# Patient Record
Sex: Female | Born: 1960 | Hispanic: No | Marital: Single
Health system: Midwestern US, Academic
[De-identification: ages and names within clinical notes are randomized; demographics above are authoritative.]

---

## 2017-12-08 IMAGING — CR UP_EXM
1 series · 1 of 1 positions shown · non-contrast
Comparison: none

[finger]
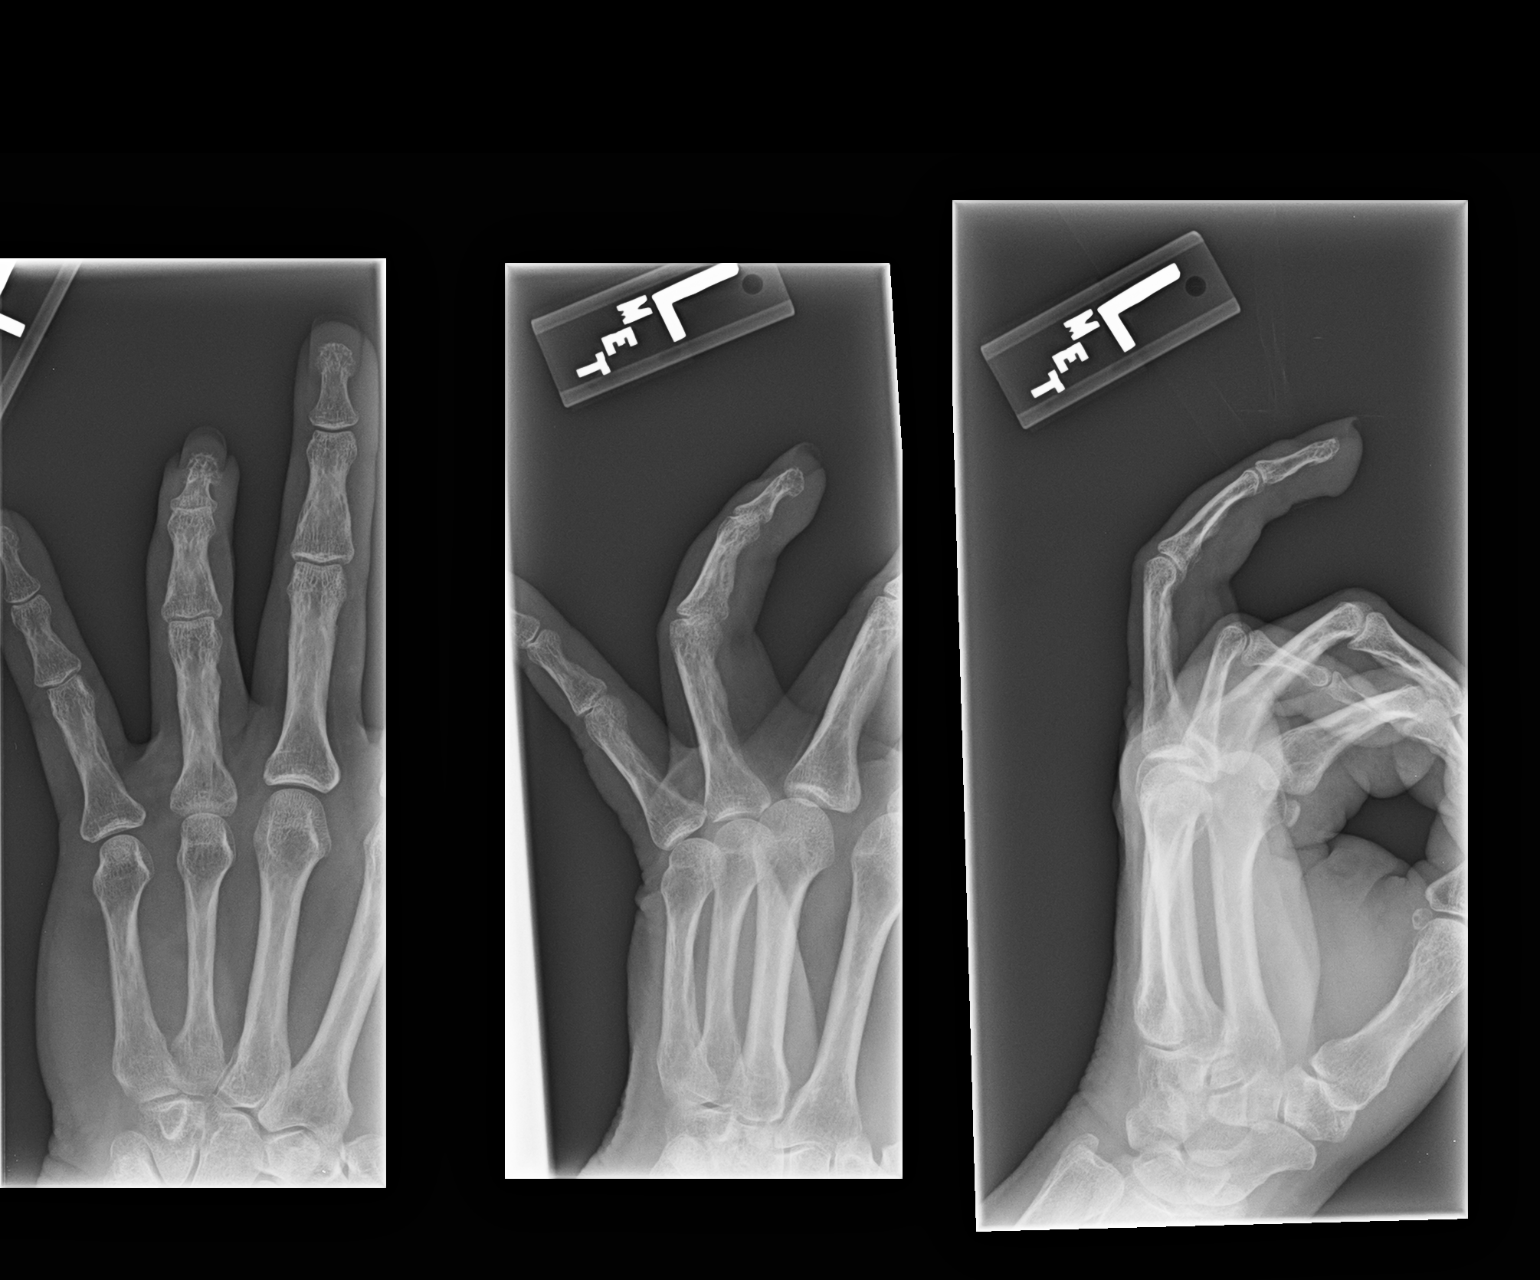

[1 of 1 positions shown; findings below may reference images not displayed]

DIAGNOSTIC STUDIES

EXAM

LEFT 4TH FINGER

INDICATION

LT RING FINGER PAIN
PT. FINGER WON'T STRAIGHTEN FOR 9-10 YEARS NO PAIN

TECHNIQUE

AP, lateral, oblique views left 4th finger

COMPARISONS

None

FINDINGS

No dislocation nor acute bony injury of the left 4th finger is identified. Flexion deformity at the
PIP and to lesser degree DIP joint is identified.

IMPRESSION

No acute bony injury of the left 4th finger. Flexion deformity of the 4th PIP and to lesser degree
DIP joints

Tech Notes:

PT. FINGER WON'T STRAIGHTEN FOR 9-10 YEARS NO PAIN

## 2018-08-13 IMAGING — MG MAMMOGRAM 3D SCREEN, BILATERAL
11 of 16 series · 11 of 16 positions shown · non-contrast
Comparison: none

[R CC (1 of 2)]
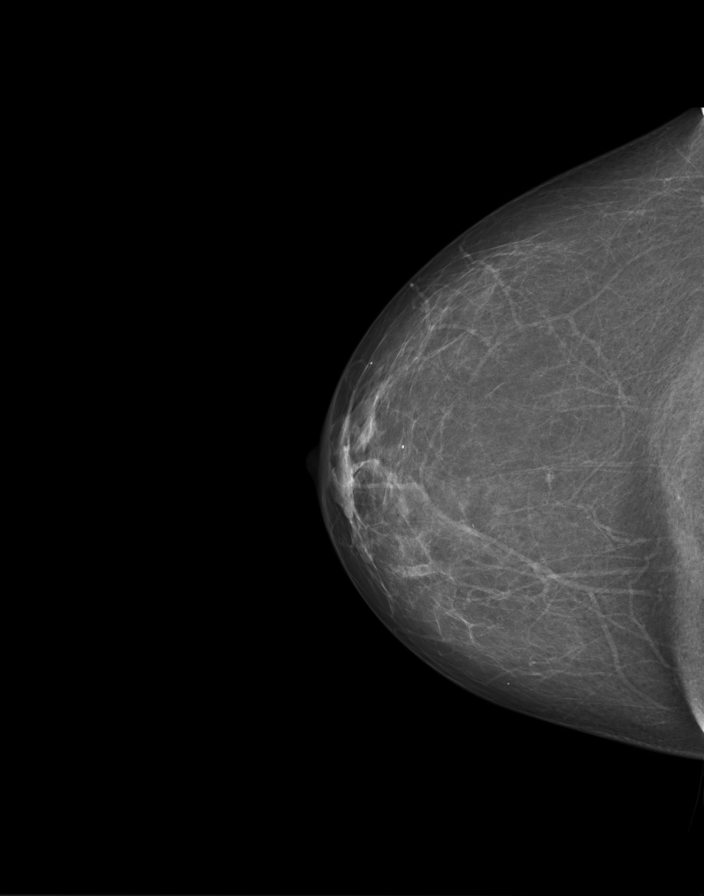

[R tomo (1 of 2)]
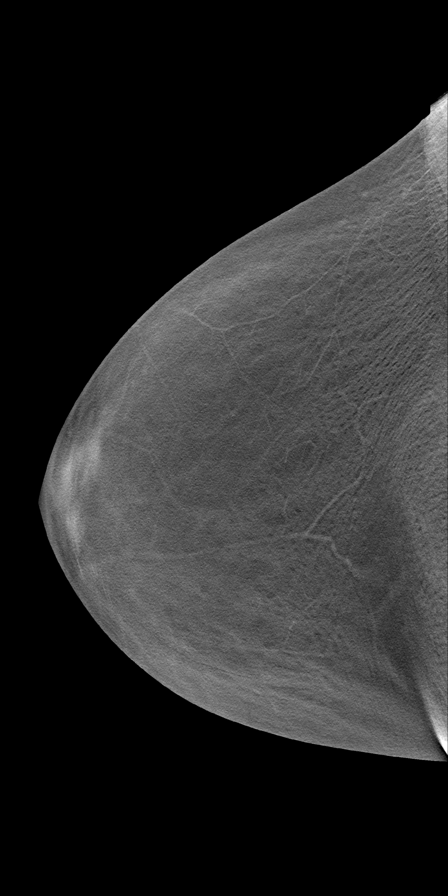

[R CC (2 of 2)]
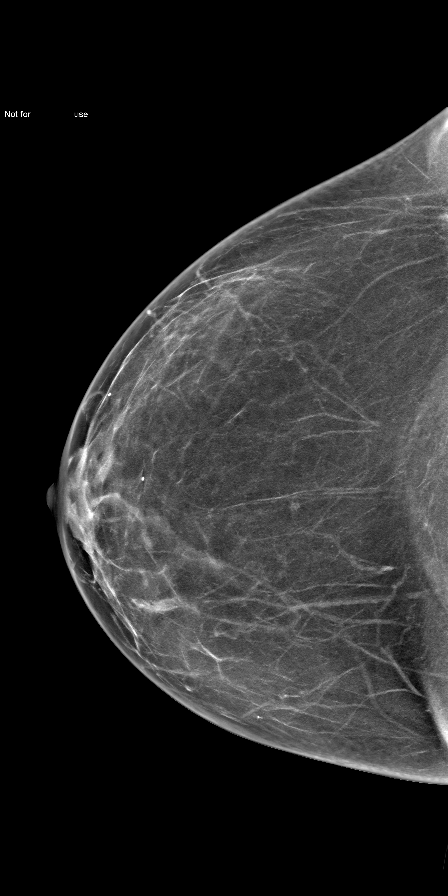

[R]
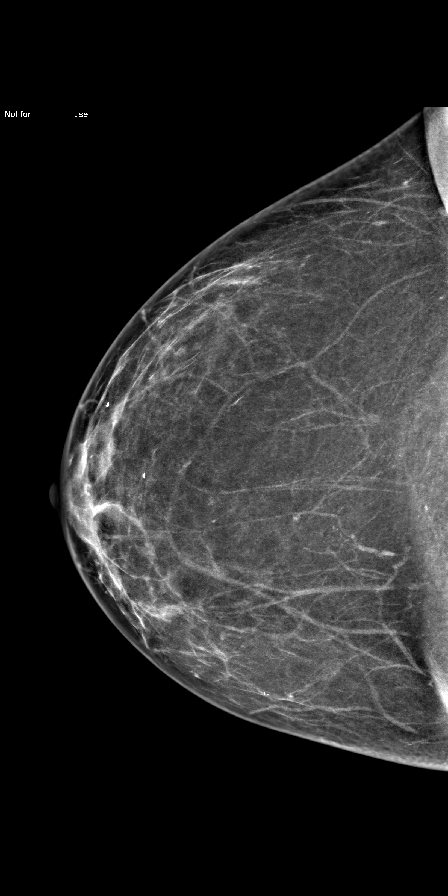

[L CC (1 of 2)]
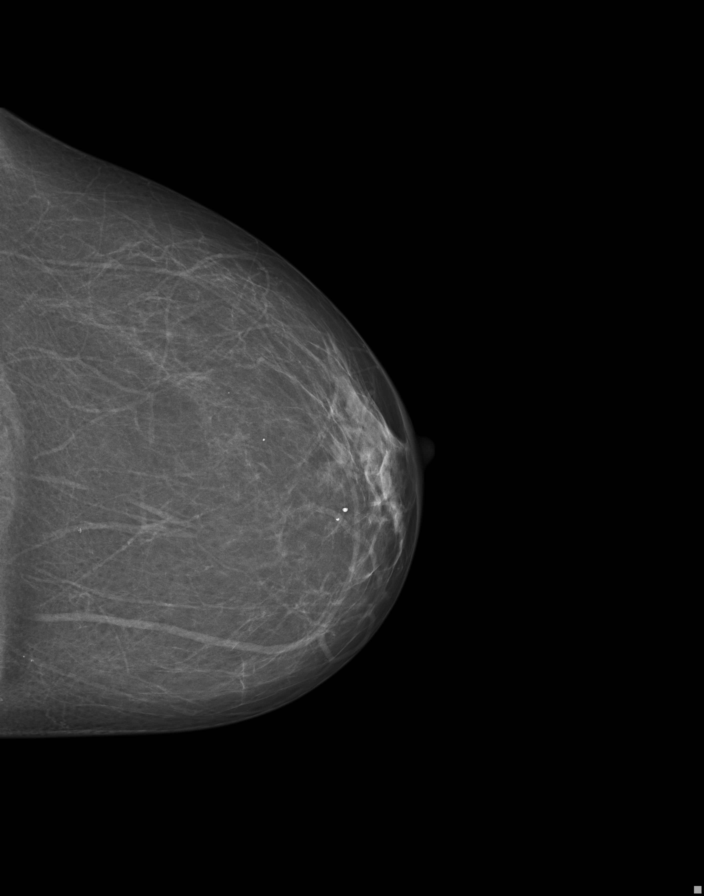

[L tomo]
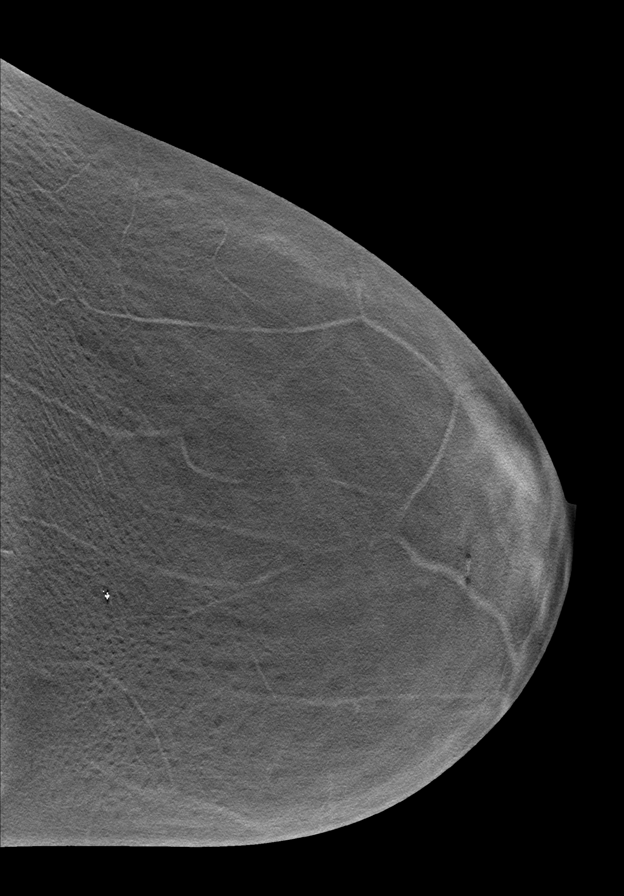

[L CC (2 of 2)]
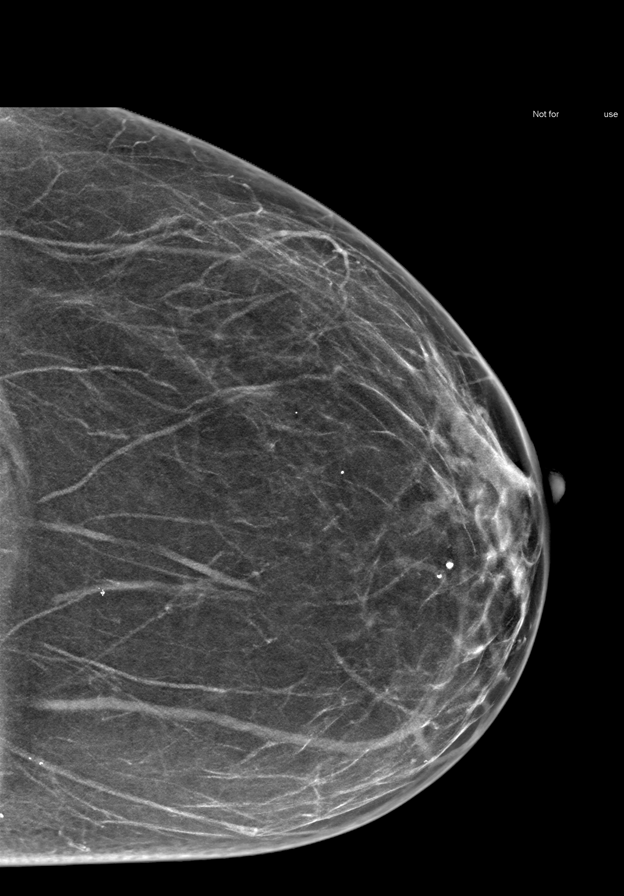

[L]
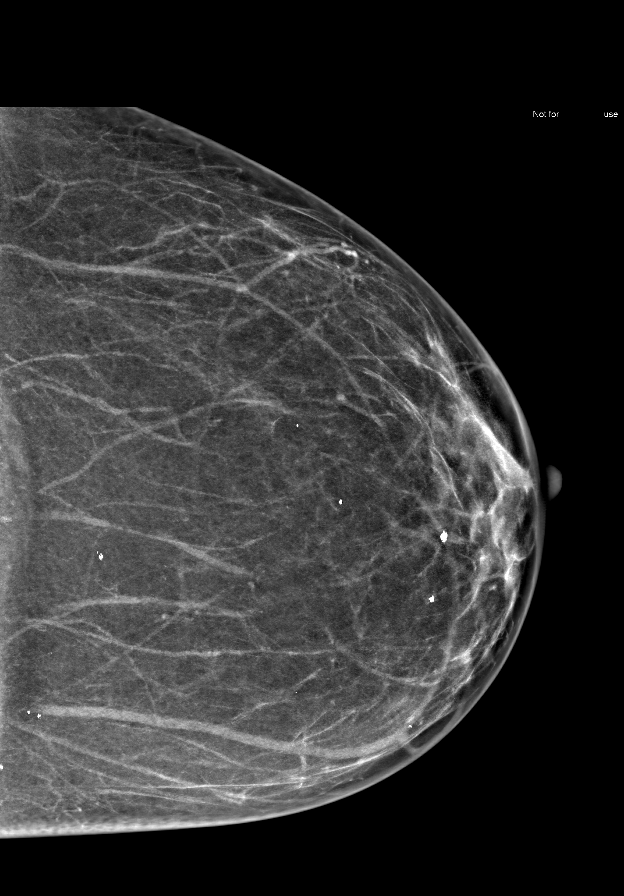

[R MLO (1 of 2)]
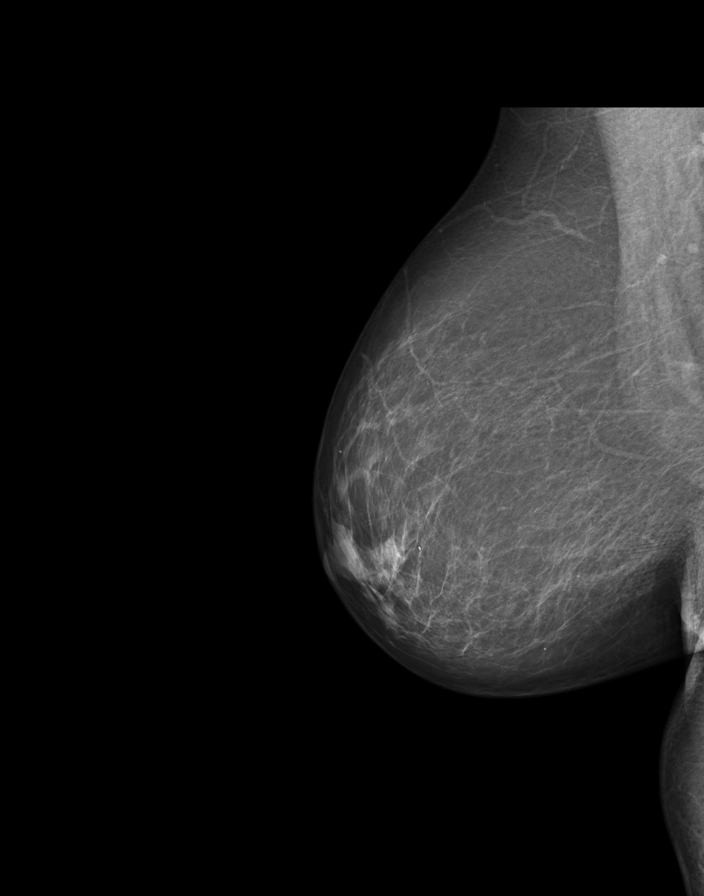

[R tomo (2 of 2)]
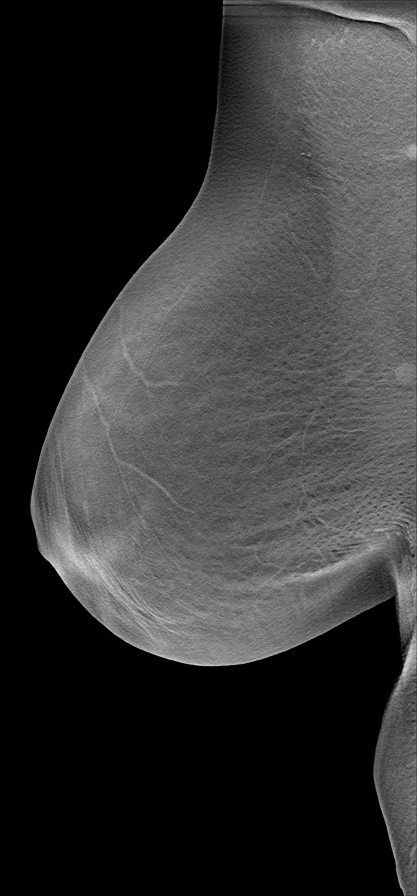

[R MLO (2 of 2)]
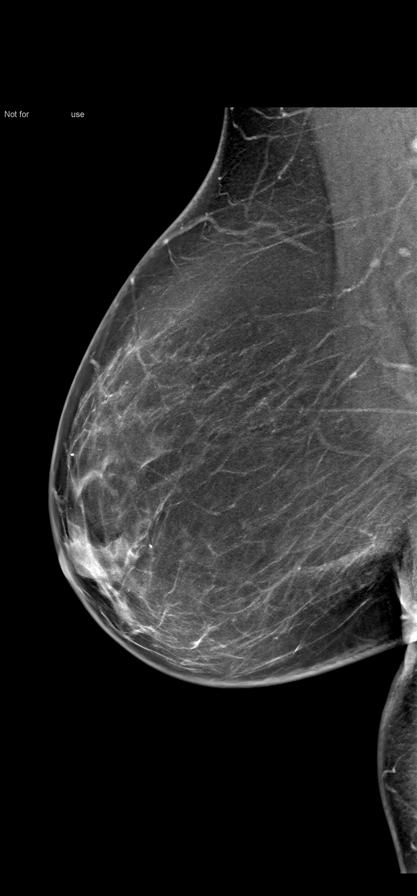

[11 of 16 positions shown; findings below may reference images not displayed]

EXAM
3D SCREENING MAMMOGRAM, BILATERAL

INDICATION
screening mammogram
BASELINE. SCR. LM

TECHNIQUE
Digital 2D CC and MLO projections obtained with 3D tomographic views per manufacturer's protocol.
ICAD version 7.2 was used during this exam.

COMPARISONS
Baseline exam.

FINDINGS
ACR Type 2: 25-50% There are scattered fibroglandular densities.
No concerning masses or calcifications seen.

IMPRESSION
No mammographic evidence of malignancy. One year follow-up is recommended.
BI-RADS 2, BENIGN.

Tech Notes:

## 2021-08-10 IMAGING — CR [ID]
1 series · 1 of 1 positions shown · non-contrast
Comparison: none

[x chest ap]
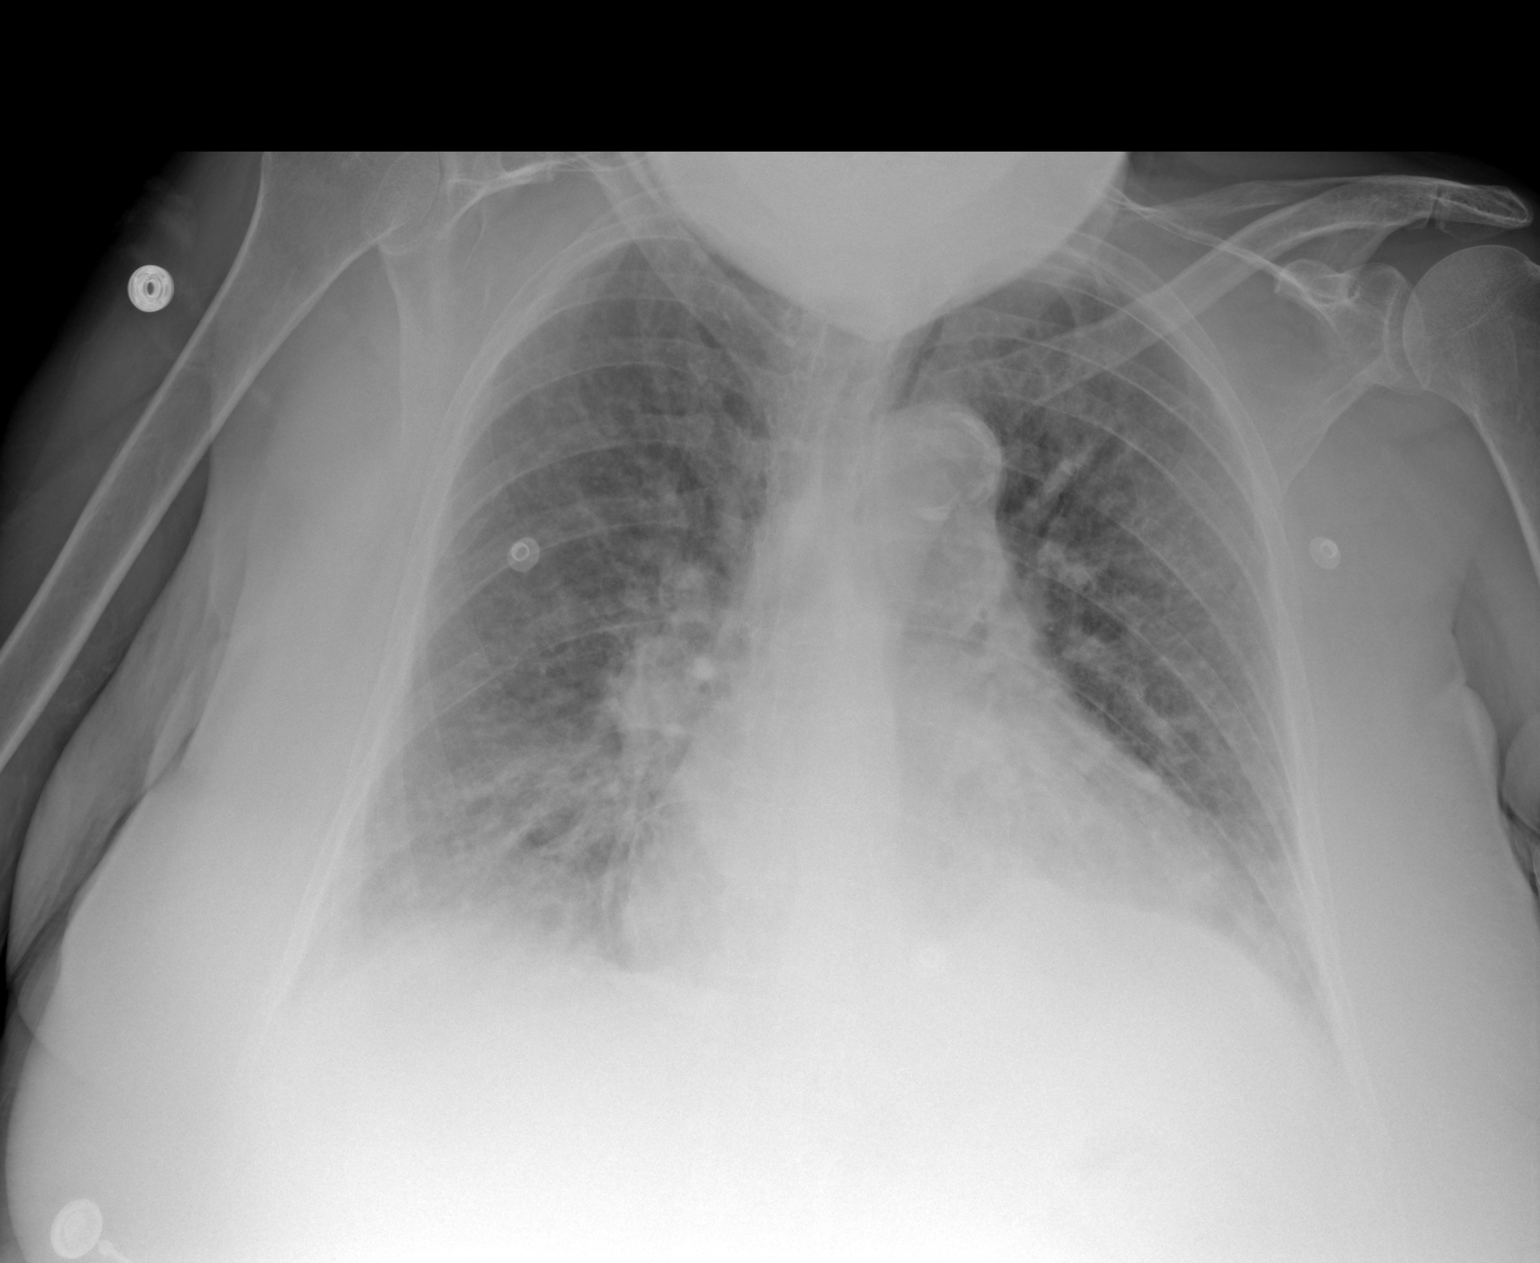

[1 of 1 positions shown; findings below may reference images not displayed]

DIAGNOSTIC STUDIES

EXAM

XR chest 1V

INDICATION

LE edema
PT REPORTS LOWER LEG SWELLING X 2 MONTHS.  HX: HTN, HIGH CHOLESTEROL, COPD, SMOKER.  AB/RG

TECHNIQUE

Single-view

COMPARISONS

None

FINDINGS

Cardiomegaly. Vascular congestion and central predominant edema. No pneumothorax is identified,
lung apices are partially obscured by patient head positioning. No acute osseous findings.

IMPRESSION

Cardiomegaly with vascular congestion and central edema.

Tech Notes:

PT REPORTS LOWER LEG SWELLING X 2 MONTHS.  HX: HTN, HIGH CHOLESTEROL, COPD, SMOKER.  AB/RG

## 2021-08-13 IMAGING — CR [ID]
3 series · 3 of 3 positions shown · non-contrast
Comparison: none

[x foot ap left]
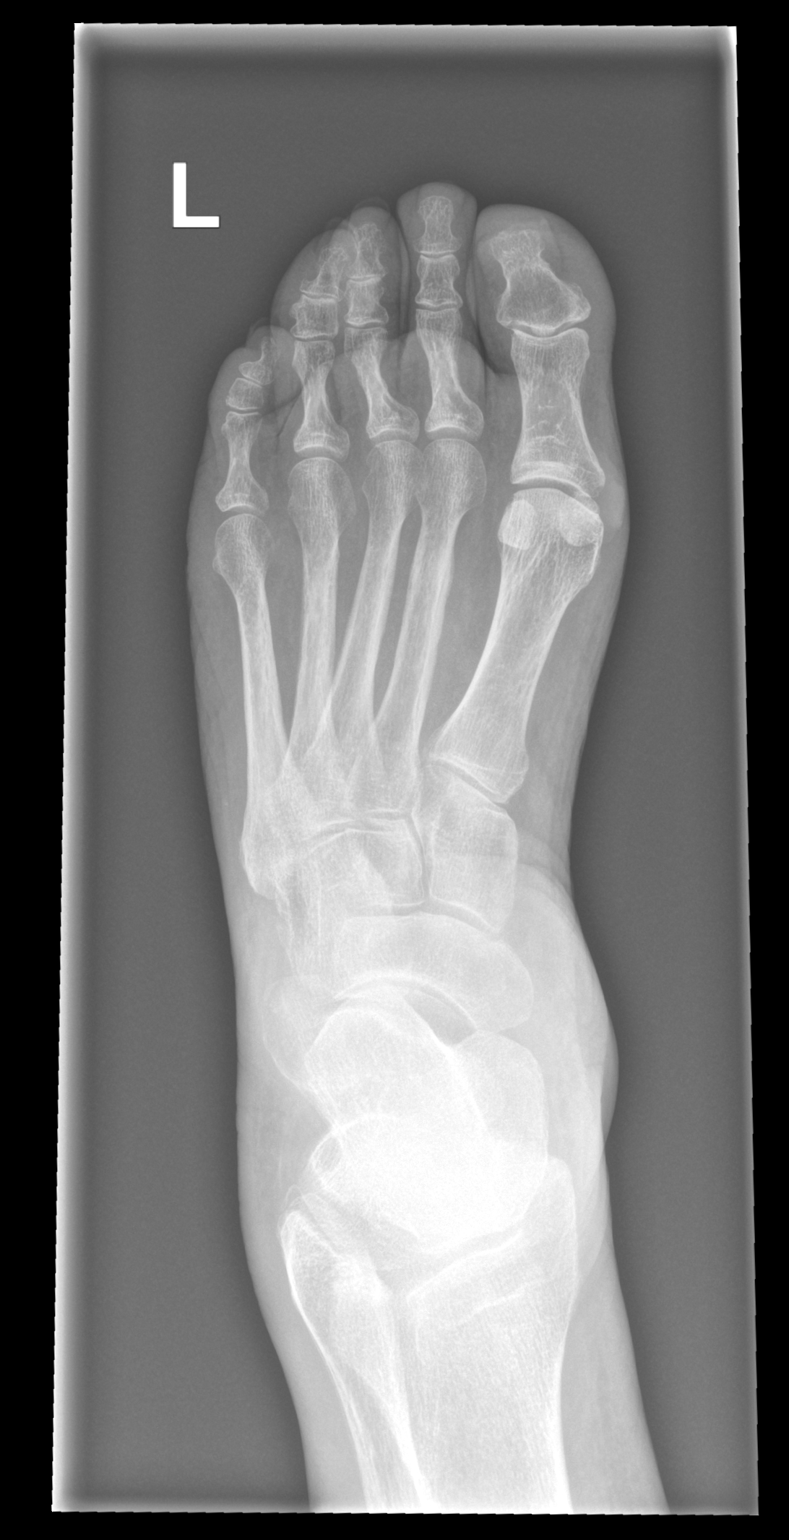

[x foot obl left]
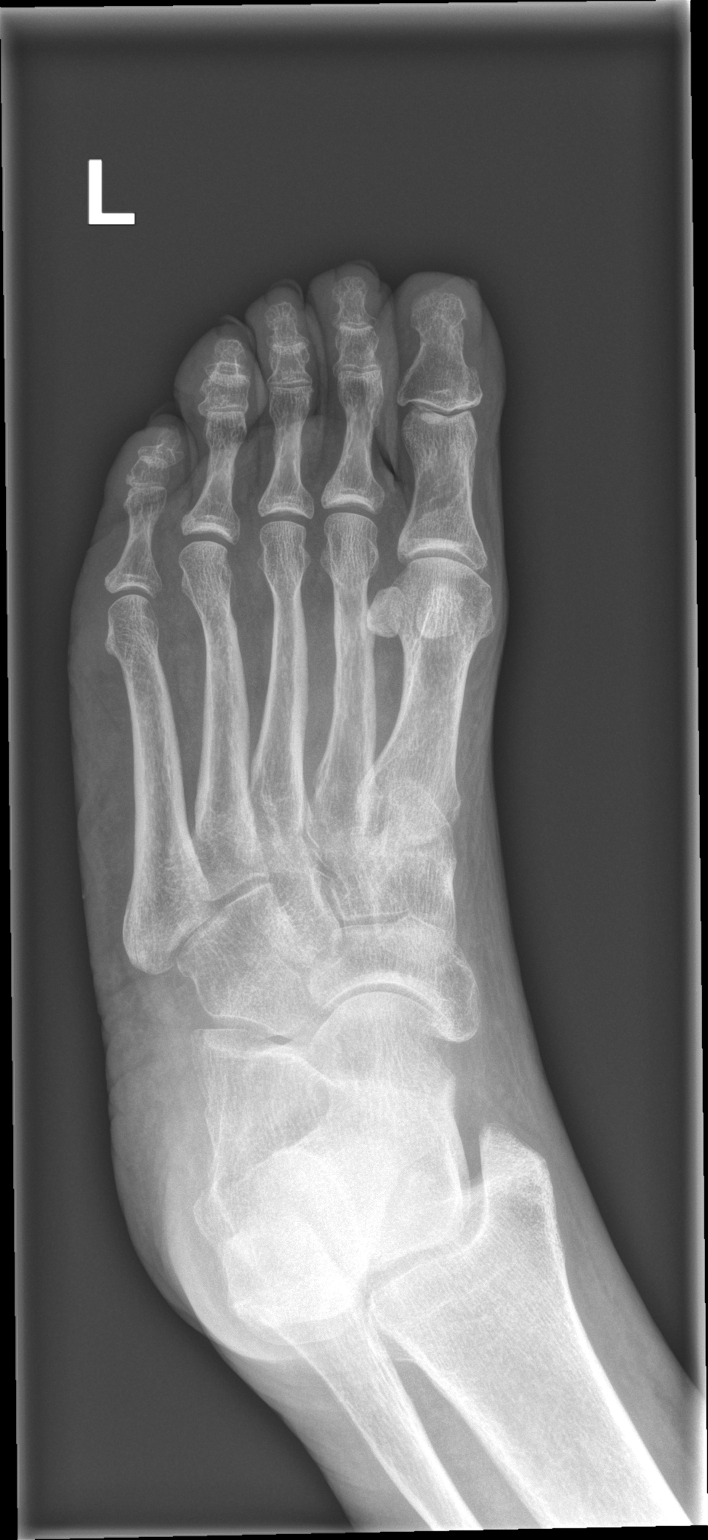

[x foot lat left]
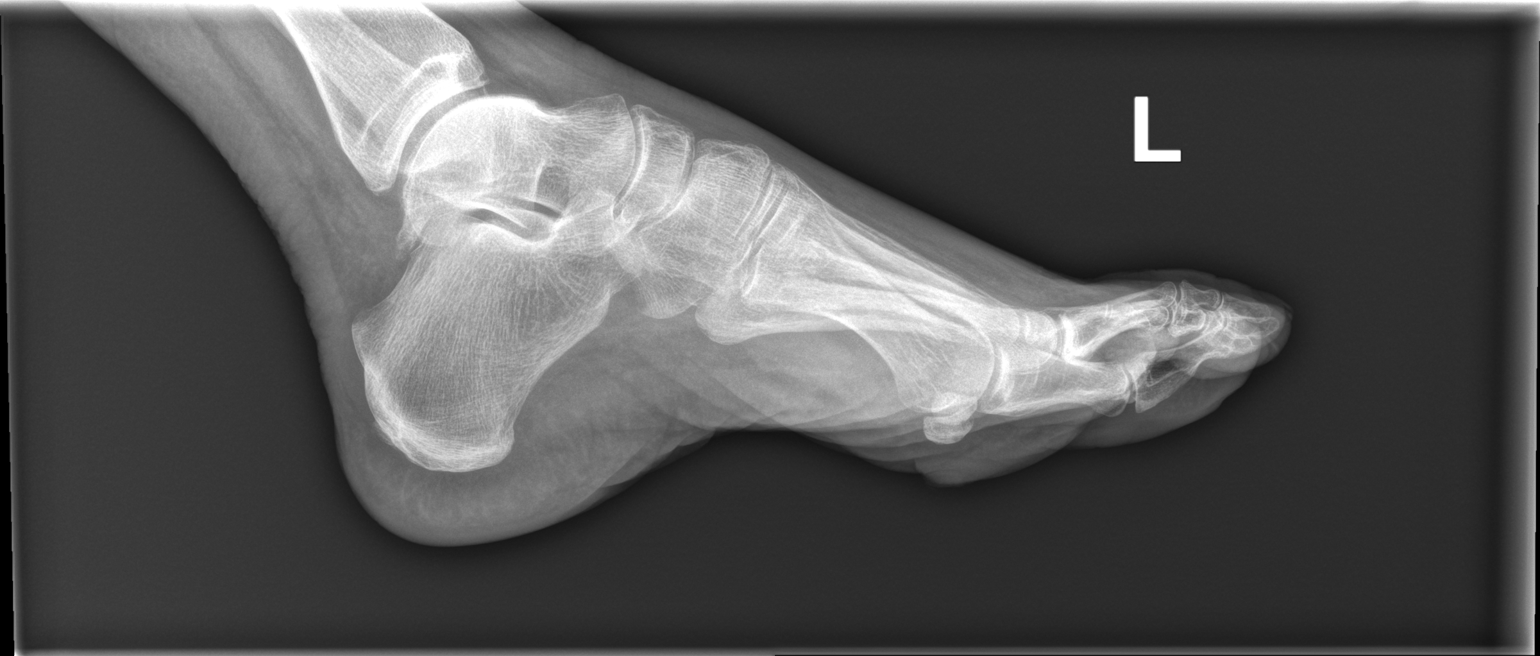

[3 of 3 positions shown; findings below may reference images not displayed]

DIAGNOSTIC STUDIES

EXAM

XR foot LT min 3V

INDICATION

DFU
DFU

TECHNIQUE

AP lateral and oblique views

COMPARISONS

None available

FINDINGS

Mild diffuse demineralization is seen. No fractures are evident. No areas of osseous destruction
are seen to indicate osteomyelitis.

IMPRESSION

Demineralization and mild degenerative changes. No fractures are seen. No erosions are evident to
indicate osteomyelitis.

Tech Notes:

DFU

## 2021-08-13 IMAGING — US ADUPLEBI
1 series · 13 of 16 positions shown · non-contrast
Comparison: none

[Series 1: us arterial duplex low ext bi · arterial · 13 of 56 slices shown]
[im 1/56]
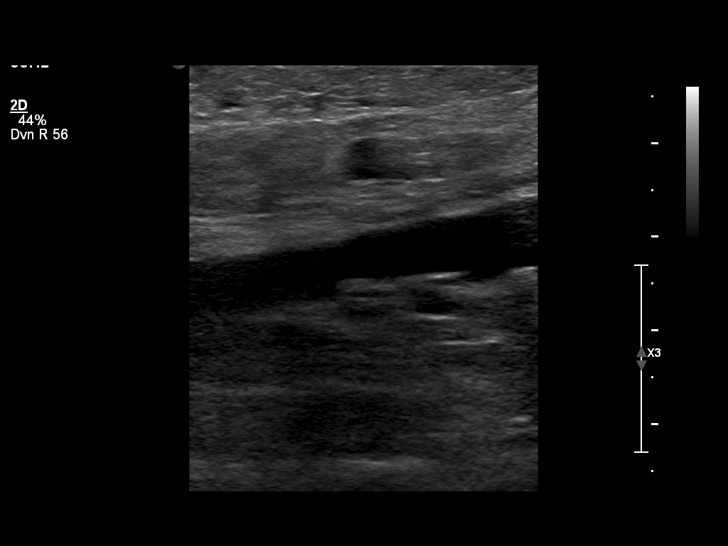
[im 4/56]
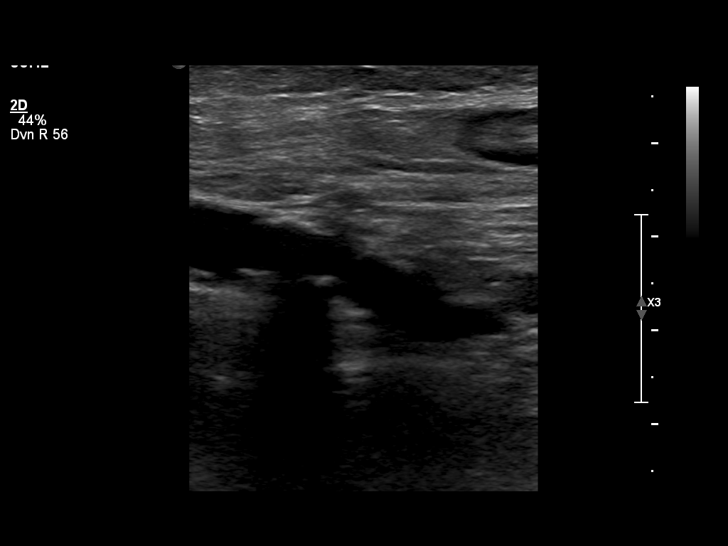
[im 12/56]
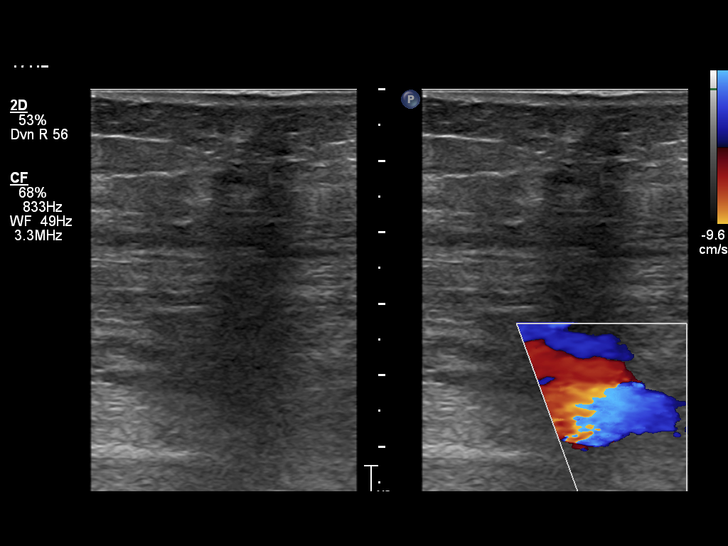
[im 15/56]
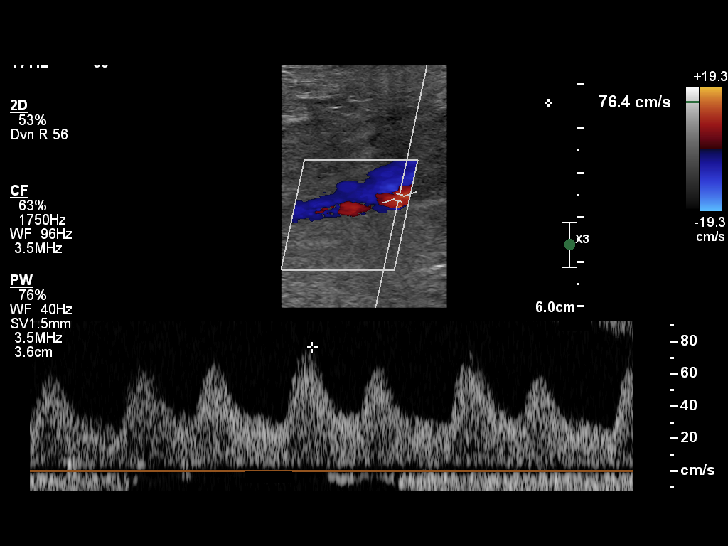
[im 19/56]
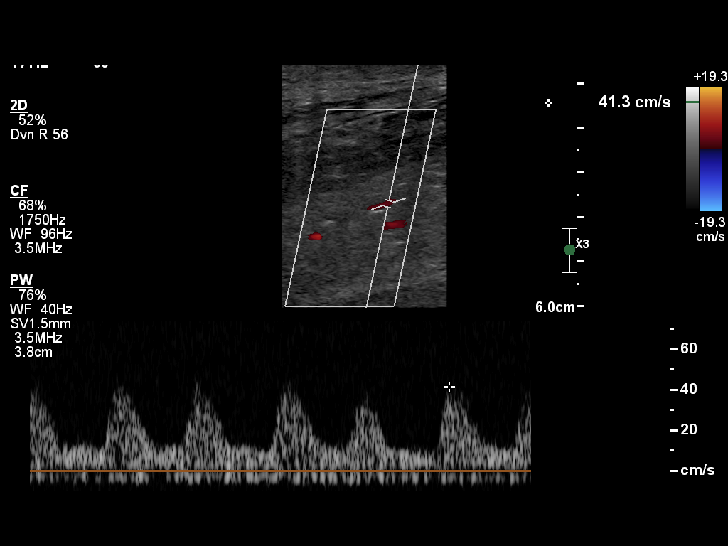
[im 23/56]
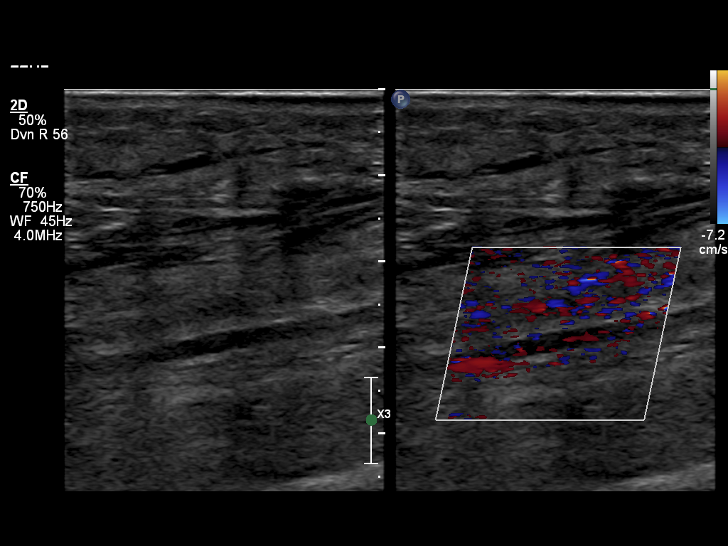
[im 30/56]
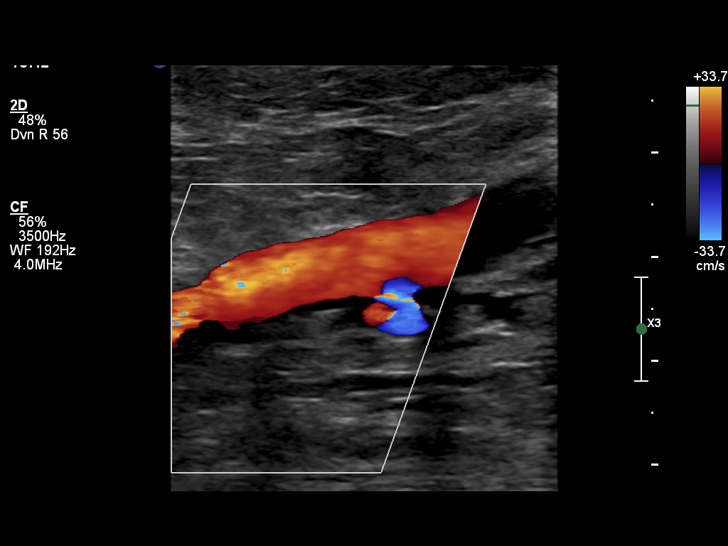
[im 34/56]
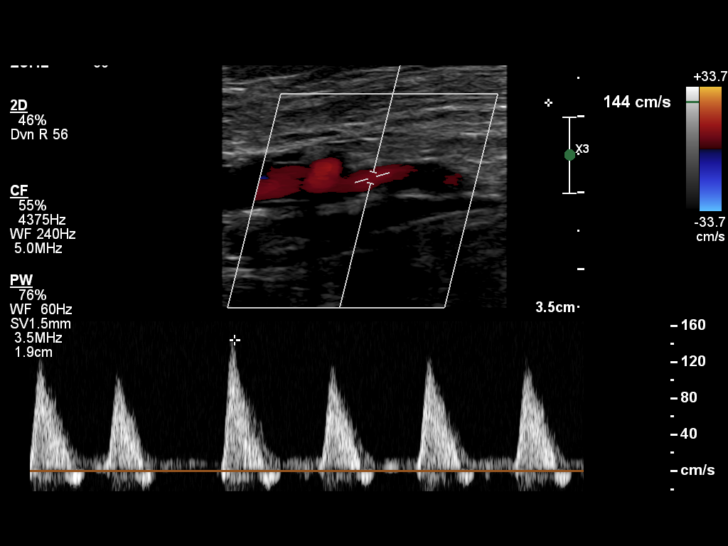
[im 37/56]
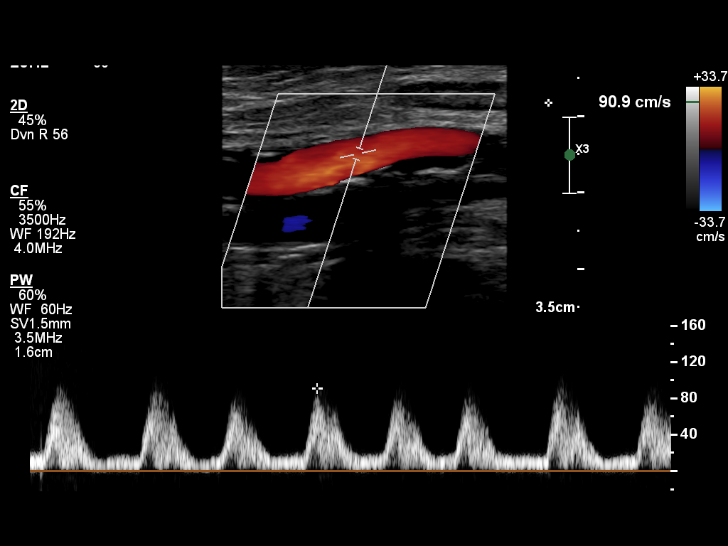
[im 41/56]
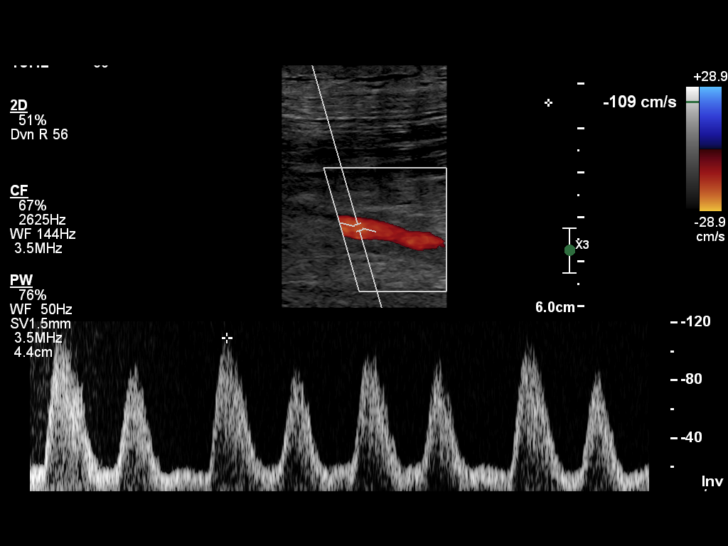
[im 45/56]
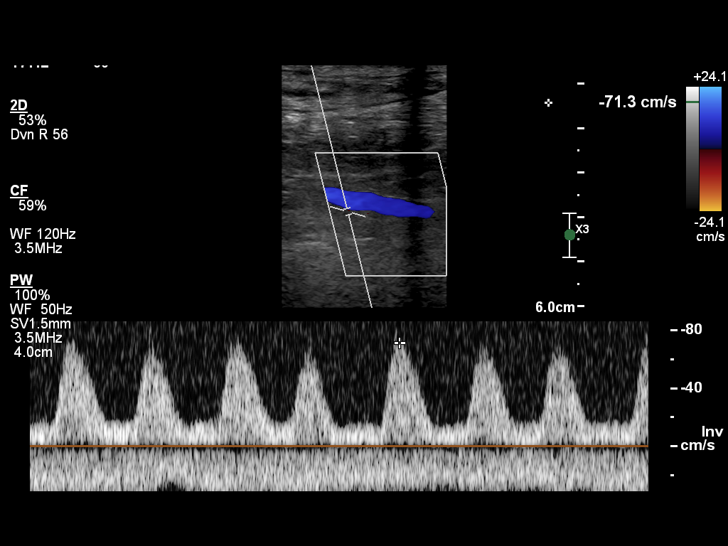
[im 52/56]
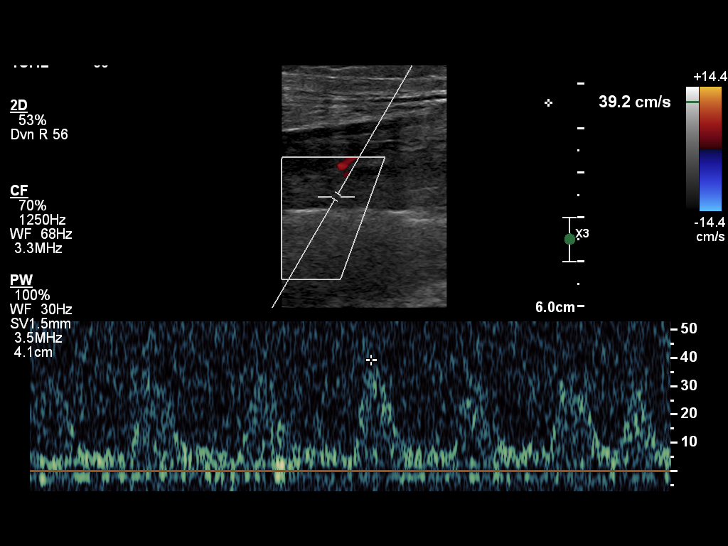
[im 56/56]
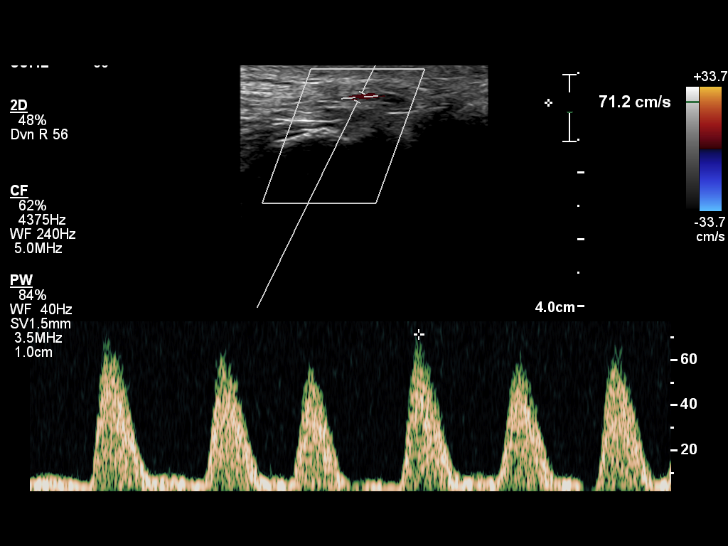

[13 of 16 positions shown; findings below may reference images not displayed]

DIAGNOSTIC STUDIES

EXAM

Bilateral lower extremity arterial Doppler ultrasound

INDICATION

PAD
TB

TECHNIQUE

High-resolution duplex imaging of the bilateral lower extremities was obtained. This includes
color, Doppler, and spectral analysis.

COMPARISONS

None available

FINDINGS

Right lower extremity:
Elevated systolic velocities are seen in the right femoral profunda consistent with narrowing of
50-70 percent. There is triphasic flow in the right superficial femoral artery.

Monophasic flow is noted beginning in the right superficial femoral artery extending distally with
poor visualization of the distal posterior tibial artery consistent with diffuse atherosclerosis.

Left lower extremity:Triphasic flow is noted the left common femoral and femoral profunda arteries.

There is monophasic flow throughout the left superficial femoral artery with elevated systolic
velocities in the region of the adductor canal consistent with 70 percent or greater stenosis.
Monophasic flow is noted throughout the left popliteal and trifurcation vessels with poor
visualization of the left posterior tibial artery distally.

IMPRESSION

70 percent or greater stenosis of the left superficial femoral artery in the region of the
adductor canal.

Diffuse atherosclerosis throughout the right lower extremity with moderate high-grade stenosis of
the right femoral profunda.

Atherosclerotic changes throughout the trifurcation vessels bilaterally.

Tech Notes:

TB

## 2021-08-13 IMAGING — CR [ID]
3 series · 3 of 3 positions shown · non-contrast
Comparison: none

[x foot ap right]
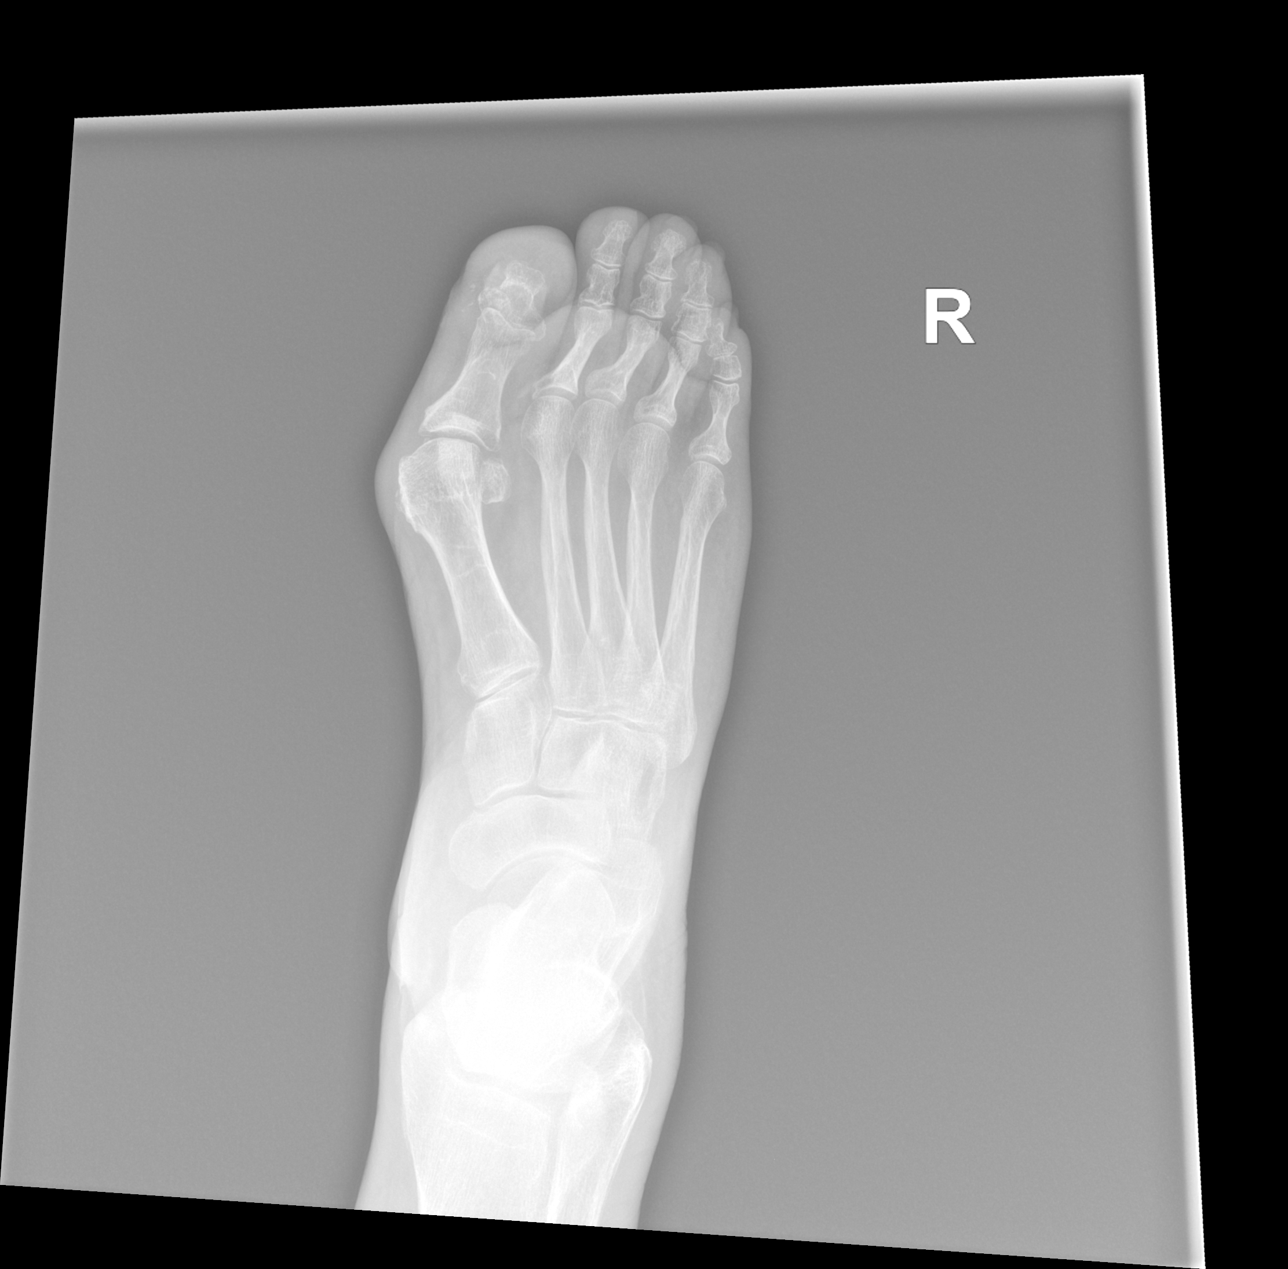

[x foot obl right]
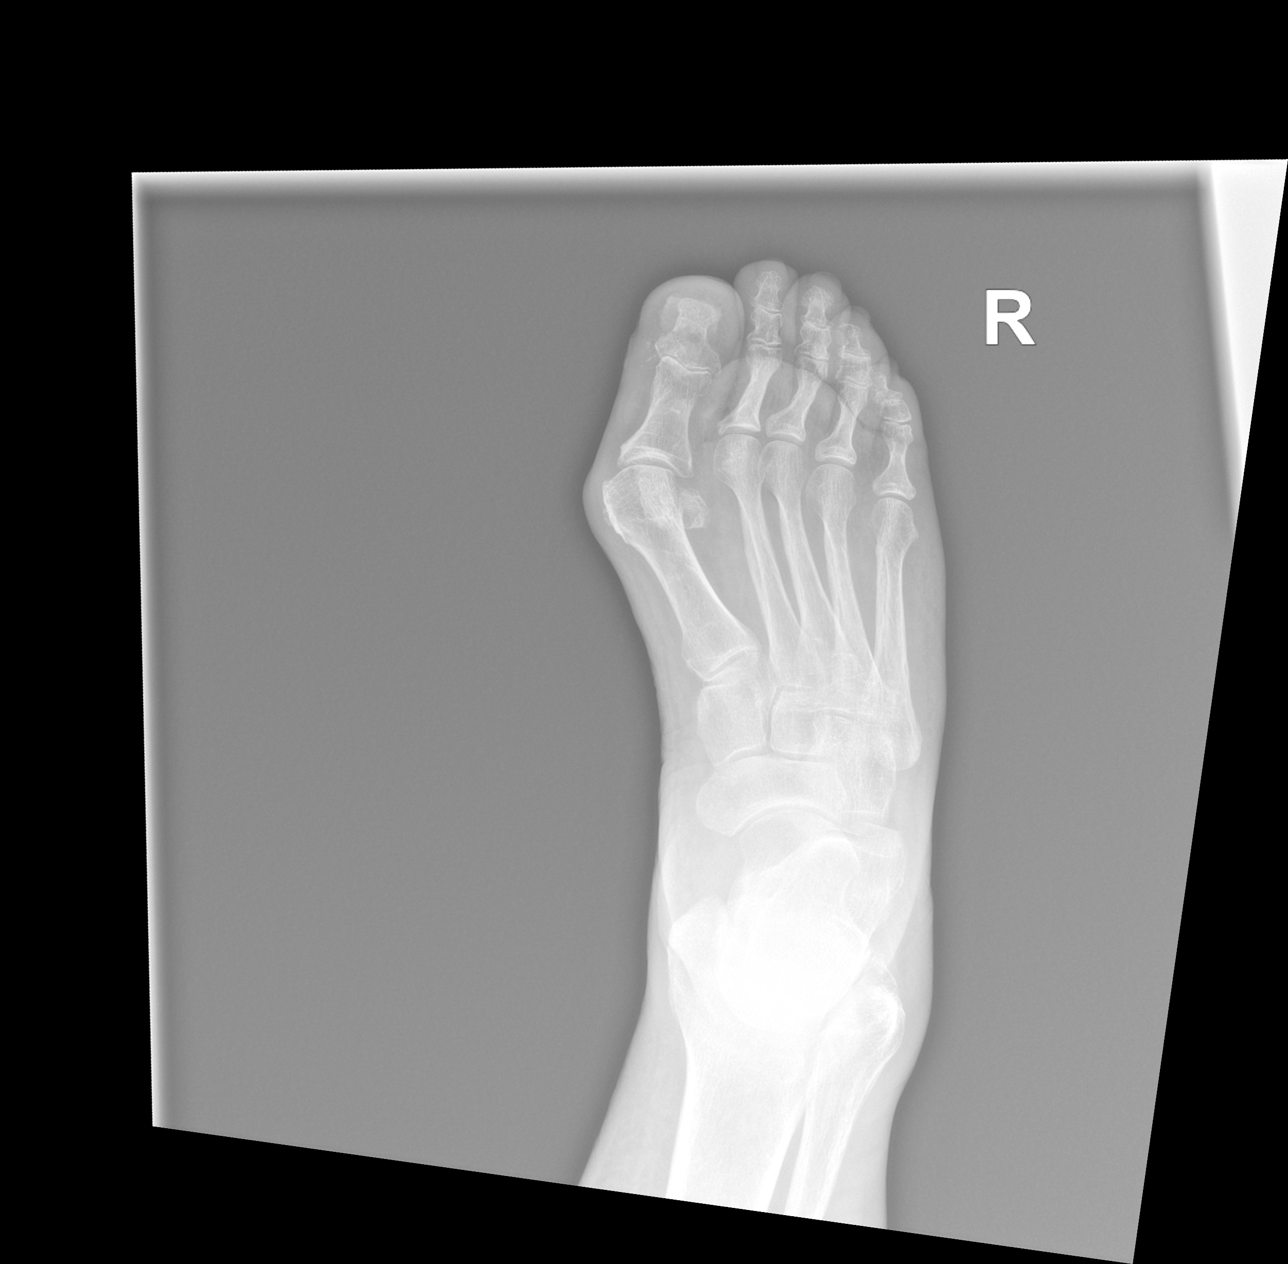

[x foot lat right]
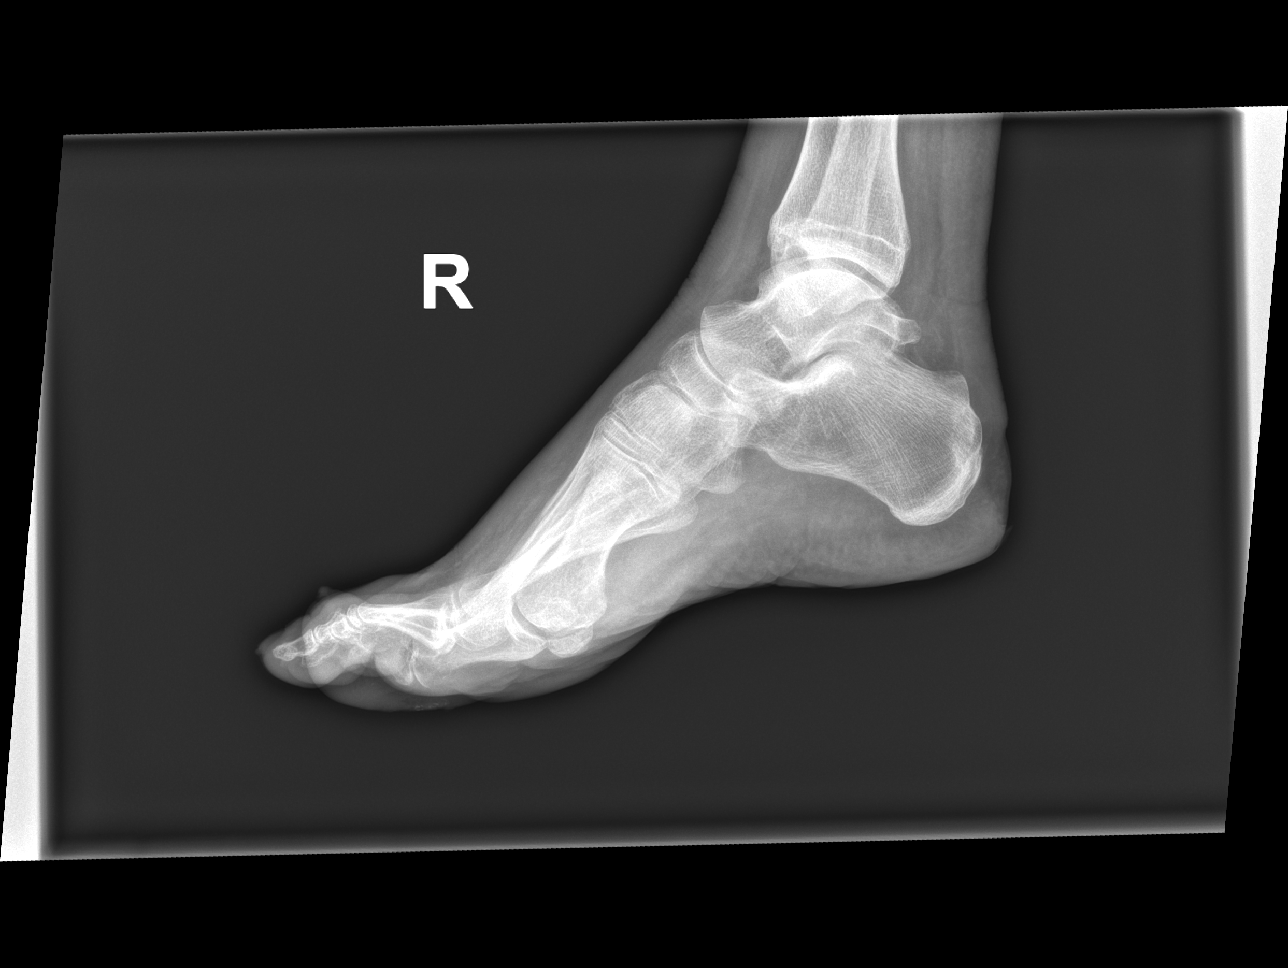

[3 of 3 positions shown; findings below may reference images not displayed]

DIAGNOSTIC STUDIES

EXAM

XR foot RT min 3V

INDICATION

DFU
DFU

TECHNIQUE

AP lateral and oblique views

COMPARISONS

None available

FINDINGS

Marked hallux valgus deformity and bunion formation is evident. Dystrophic calcifications are seen
adjacent to the DIP joint of the 1st digit. No fractures are seen. No erosions are seen to indicate
osteomyelitis.

IMPRESSION

Hallux valgus deformity and bunion formation. No fractures are evident. There is no evidence for
osteomyelitis.

Tech Notes:

DFU

## 2021-08-13 IMAGING — US Imaging study
1 series · 12 of 16 positions shown · non-contrast
Comparison: none

PROCEDURES:

Echocardiographic Report:
Transthoracic echocardiogram with complete 2D, M-Mode, pulsed continuous wave and color Doppler
were utilized to image cardiac structures in accordance with the standards of the American College
of Cardiology and the American Society of Echocardiography.
-
INDICATIONS:
Indication(s): congestive heart failure, anasarca.
Measurements:
2D/M Mode
Doppler
Measurement
Value
Normal Range
[ 0.60 - 0.90 ] cm
AV Peak Vel
[ 100.00 - 170.00 ]   cm/sec
[ 3.78 - 5.22 ] cm
AV VTI
cm
[ 2.16 - 3.48 ] cm
AV Mean Vel
[ 70.00 - 90.00 ] cm/sec
AV Mean PG
[ 2.00 - 4.00 ] mmHg
LA Diam 2D
AV Peak PG
[ 2.00 - 9.00 ] mmHg
AoR Diam 2D
[ 2.30 - 3.10 ] cm
AVA VTI
[ 2.00 - 4.00 ] cm2
[ 52 - 76 ] %
LVOT Diam
[ 2.10 - 2.50 ] cm
[ 46 - 78 ] %
LVOT Mean Vel
[ 60.00 - 80.00 ] cm/sec
EF Mod BP
[ 54 - 74 ] %
LVOT Mean PG
[ 1.00 - 3.00 ] mmHg
LA Volume 2C
[ 22.00 - 52.00 ] ML
LVOT Peak Vel
[ 70.00 - 110.00 ] cm/sec
LA Volume 4C
[ 22.00 - 52.00 ] Ml
LVOT Peak PG
[ 2.00 - 6.00 ] mmHg
LA Volume Index
[ 16 - 34 ] mL/m2
LVOT VTI
[ 20.00 - 30.00 ] cm
TAPSE
[ 1.70 - 3.00 ] cm
MV E Peak Vel
[ 60.00 - 130.00 ] cm/sec
MV A Peak Vel
[ 100.00 - 120.00 ]   cm/sec
MV Decel Time
[ 104.00 - 258.00 ] msec
PV Peak Vel
[ 40.00 - 80.00 ] cm/sec
PV Peak PG
mmHg
TR Peak Vel
[ 100.00 - 280.00 ]   cm/sec
TR Peak PG
RVSP
[ 10.00 - 36.00 ] mmHg
Lat E` Vel
[ 10.00 - 15.00 ] cm/sec
Lateral E/E`
[ 1.00 - 2.00 ] ratio

[Series 1: view not recorded · 92 acquisitions, 12 frames shown]
[im 1/92]
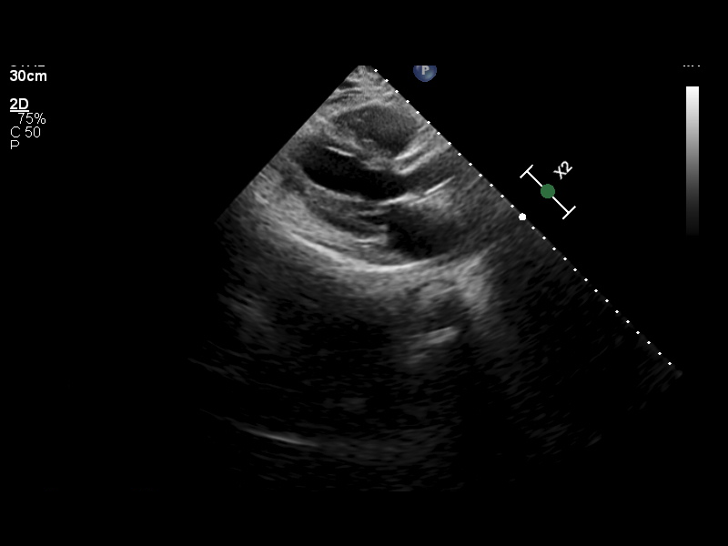
[im 13/92]
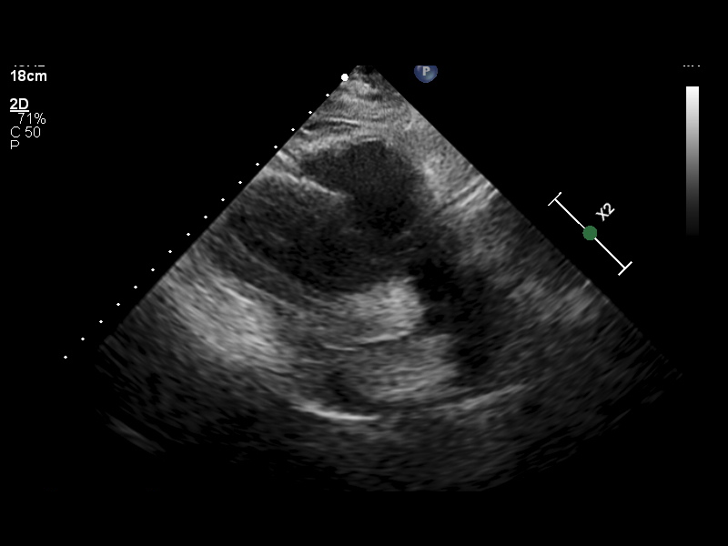
[im 19/92]
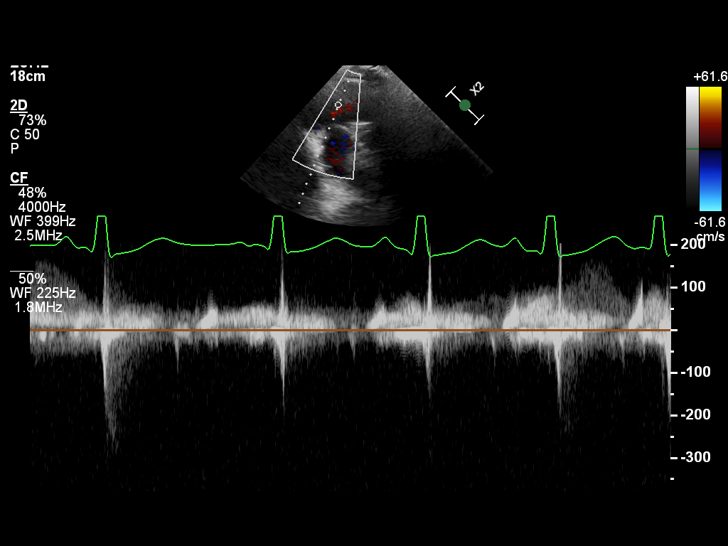
[im 25/92]
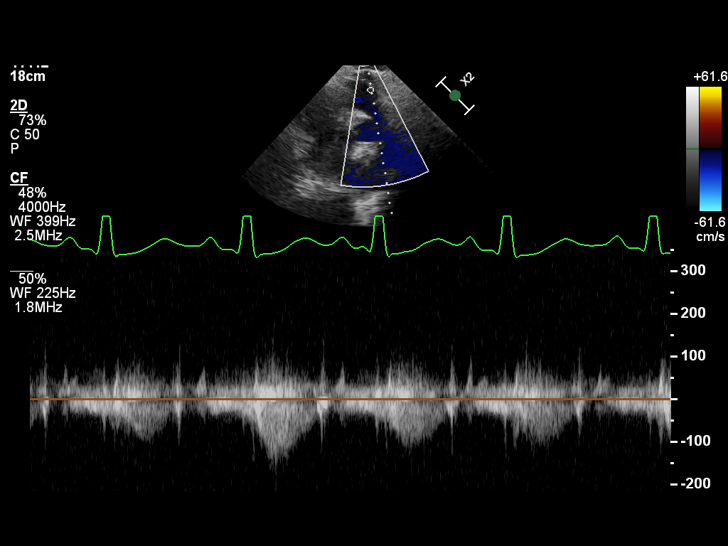
[im 37/92]
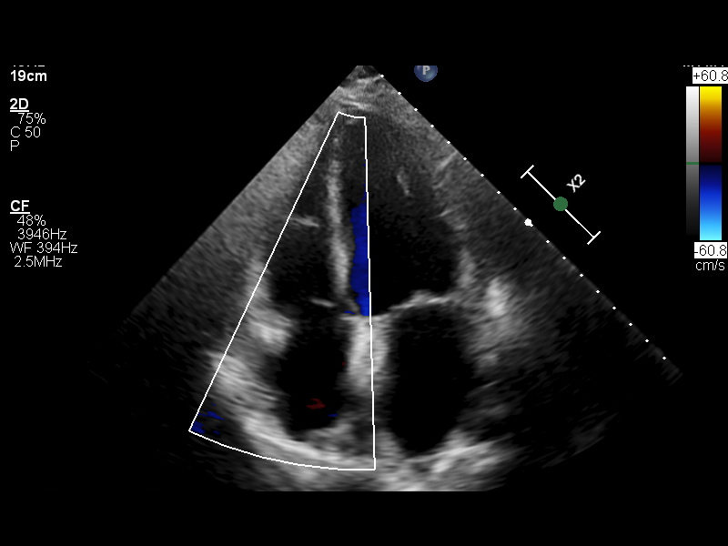
[im 43/92]
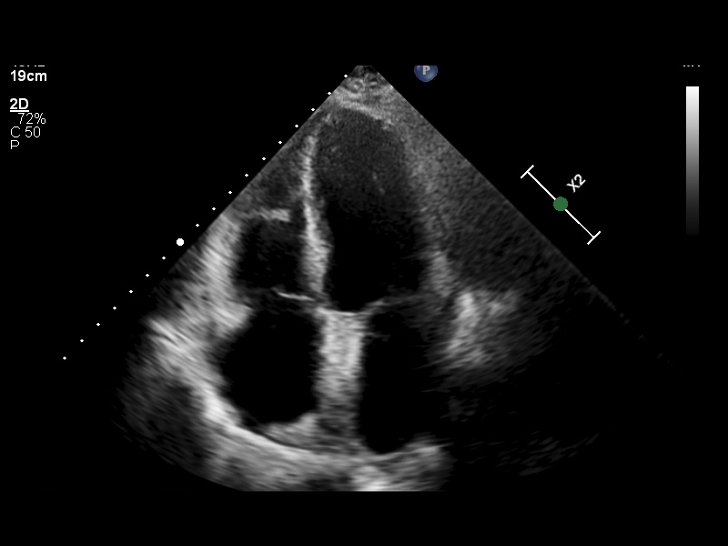
[im 49/92]
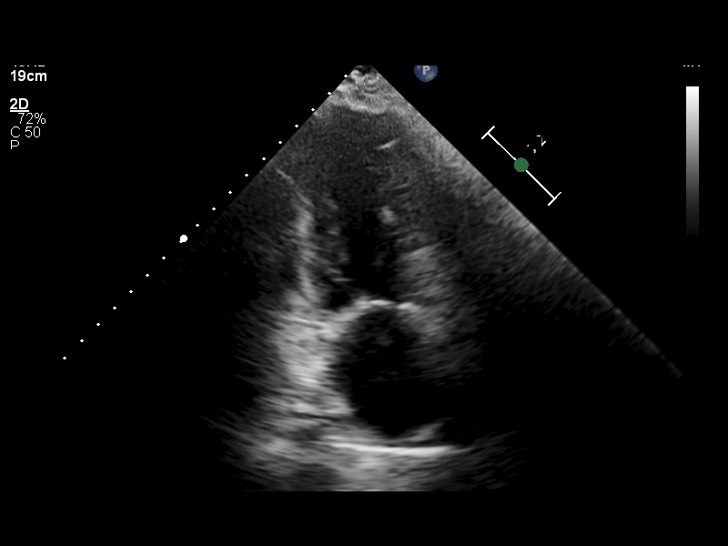
[im 61/92]
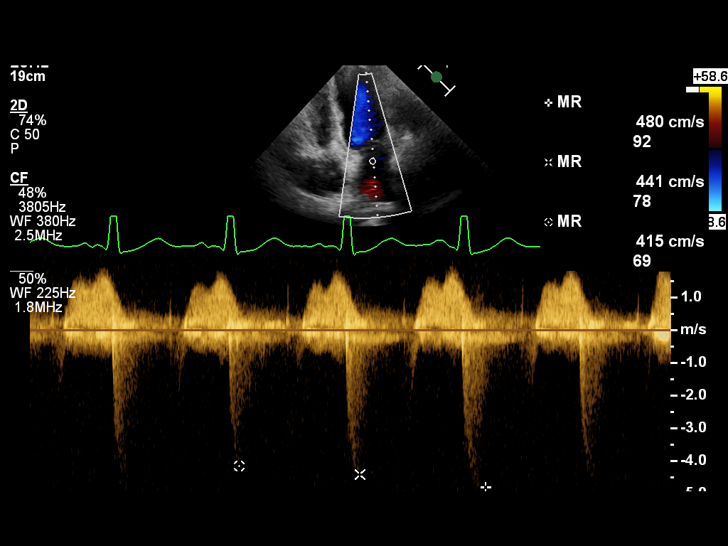
[im 67/92]
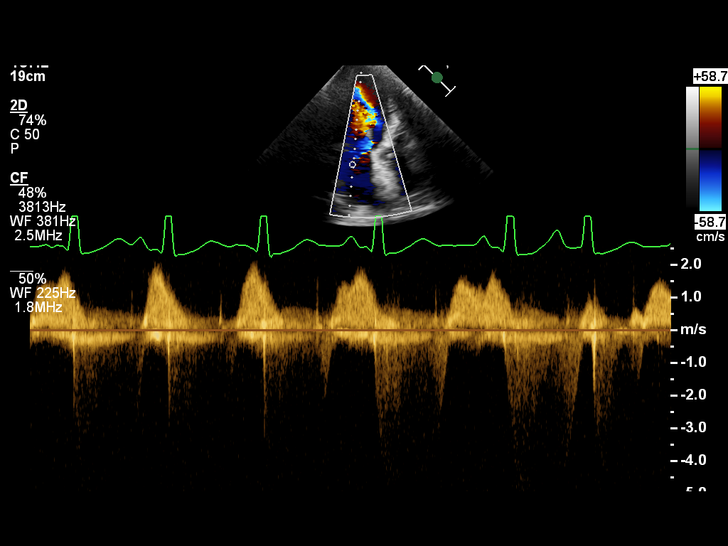
[im 73/92]
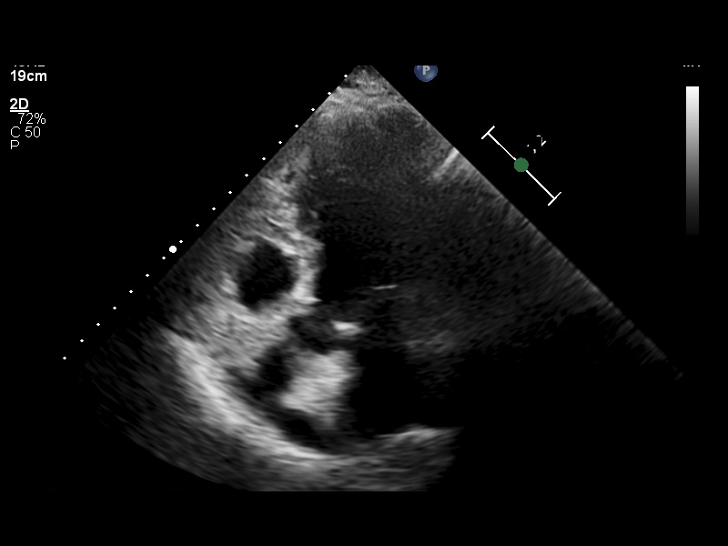
[im 85/92]
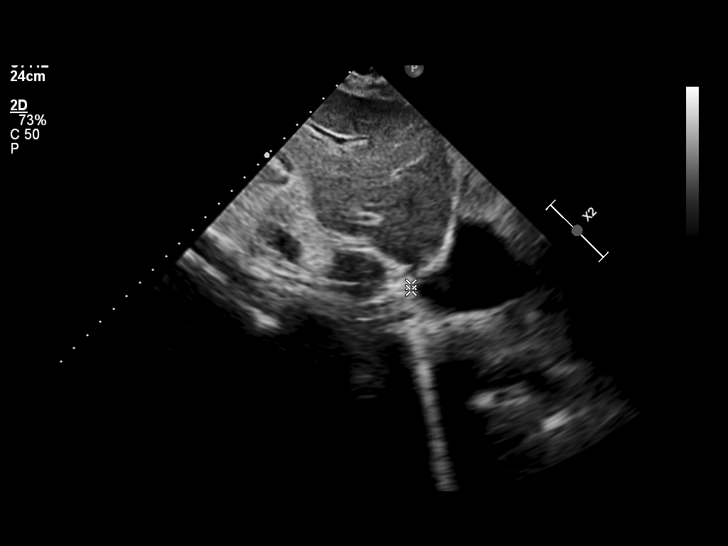
[im 92/92]
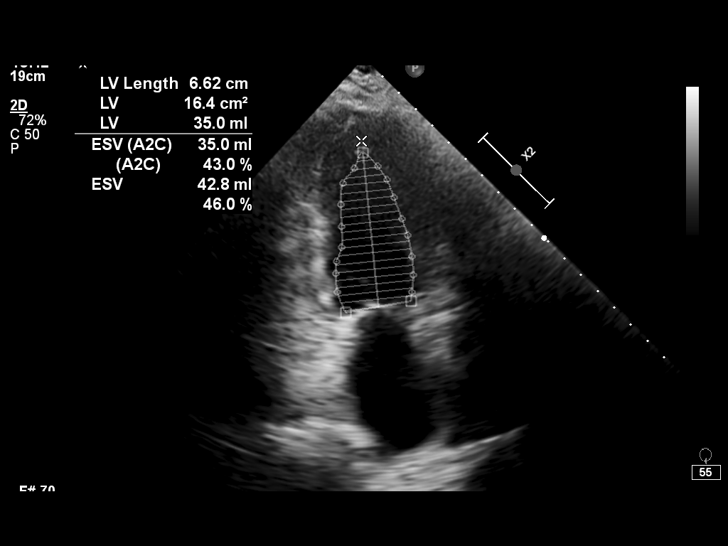

[12 of 16 positions shown; findings below may reference images not displayed]

FINDINGS: Study Details:
Technically difficult study due to limited acoustic windows.

Left Ventricle:
Normal left ventricular size. LV Ejection Fraction was 50-55 %. The left ventricular ejection
fraction was calculated using the biplane Ansberto`Bob Bernard rule method. Mild left ventricular
hypertrophy. Diastolic dysfunction is indeterminate. LA pressure cannot be estimated.

Resting Segmental Wall Motion Analysis:
Total wall motion score is 1.00. There are no regional wall motion abnormalities.

Right Ventricle:
Normal right ventricular size and systolic function. Normal right ventricular size.

Left Atrium:
The left atrium is normal in size.

Right Atrium:
The right atrium is normal in size.

Atrial Septum:
The interatrial septum is normal in appearance.

Mitral Valve:
The mitral valve demonstrates normal leaflet morphology. No Doppler evidence of hemodynamically
significant mitral valve dysfunction.

Aortic Valve:
Structurally normal, trileaflet aortic valve. Aortic valve sclerosis without stenosis. The peak
transaortic velocity was 253 cm/sec. The mean transaortic gradient was 13 mmHg. The aortic valve
area by the continuity equation (using VTI) was 1.51 cm2. The aortic valve area indexed to the BSA
is 0.75 cm2/m2.

Tricuspid Valve:
Normal appearance of the tricuspid valve. There is trace tricuspid regurgitation. Estimated peak
RVSP is 38 mmHg.

Pulmonic Valve:
Pulmonic valve not well visualized.

Pericardium:
No significant pericardial effusion.

Aorta:
The aortic root was normal in size. Sinus of Valsalva: 3.0 cm. Normal range for aortic root on the
basis of BSA is 2.7 cm. to 4.1 cm. Ascending Aorta 3.3 cm.

IVC:
The inferior vena cava is of normal size. The IVC diameter was 18 mm. The inferior vena cava shows
a normal respiratory collapse consistent with normal right atrial pressure (3 mmHg).

Pulmonary Artery:
Not well visualized.

Pulmonary Veins:
Pulse Doppler interrogation shows normal systolic predominant flow.

-

CONCLUSIONS:

1. Technically difficult study with limited endocardial visualization. Contrast was not
administered.
2. Grossly normal LV systolic function.
3. No color flow or Doppler evidence of hemodynamically significant valvular dysfunction.

Samdoval, Gdjfkgn., FACC

Tech Notes:

## 2021-08-14 IMAGING — MR Ankle^Routine
7 of 8 series · 37 of 40 positions shown · non-contrast
Comparison: none

[Series 3: T1 · axial · 4.0mm · 0.59mm/px · z∈[-86,+34]mm · 5 of 25 slices shown (1 of 3)]
[im 1/25]
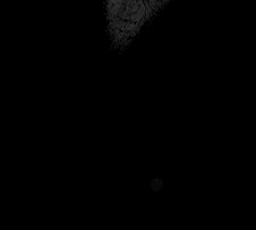
[im 7/25]
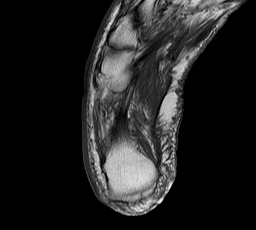
[im 13/25]
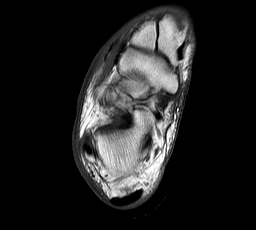
[im 19/25]
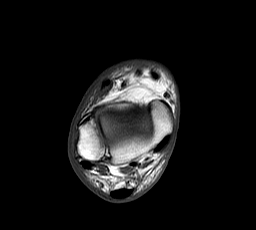
[im 25/25]
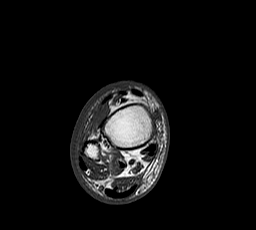

[Series 4: T2 fat-sat · axial · 3.0mm · 0.29mm/px · z∈[-94,-9]mm · 6 of 23 slices shown]
[im 1/23]
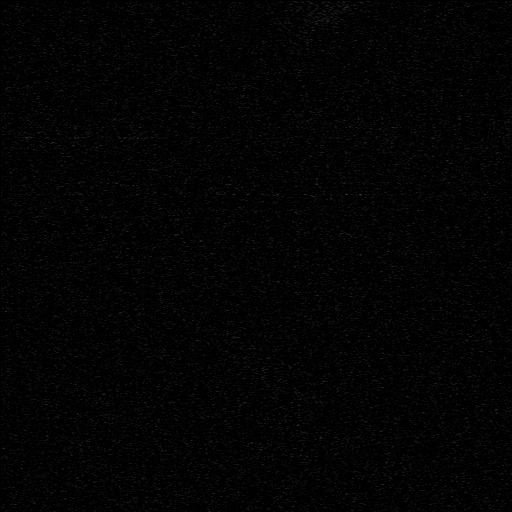
[im 5/23]
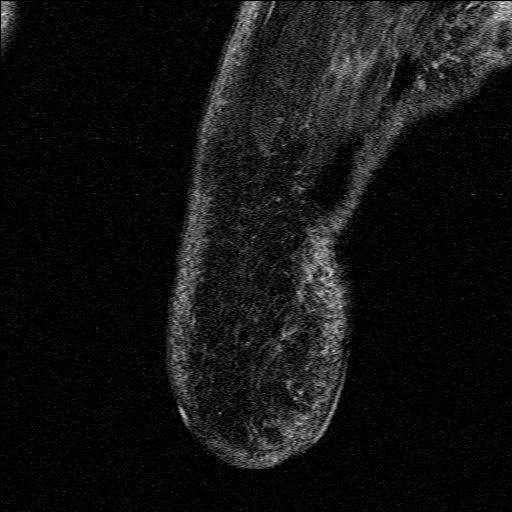
[im 9/23]
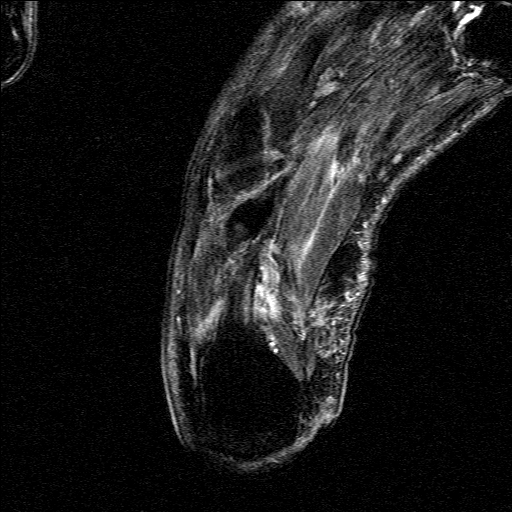
[im 14/23]
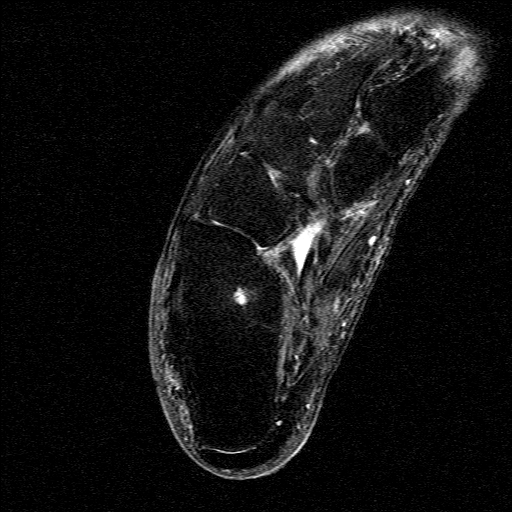
[im 18/23]
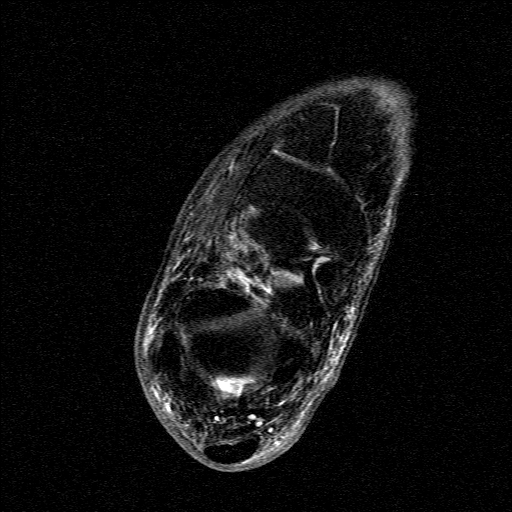
[im 23/23]
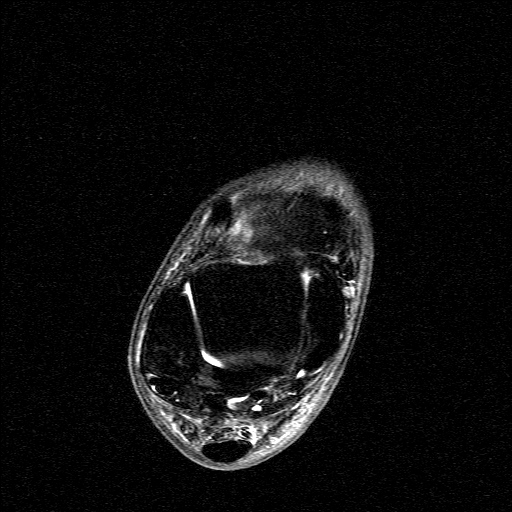

[Series 5: T1 · sagittal · 3.0mm · 0.47mm/px · 5 of 20 slices shown (2 of 3)]
[im 1/20]
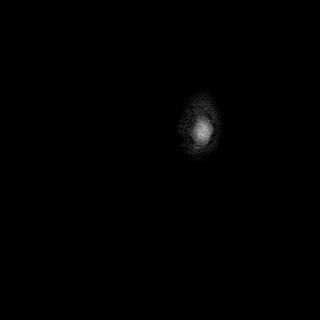
[im 5/20]
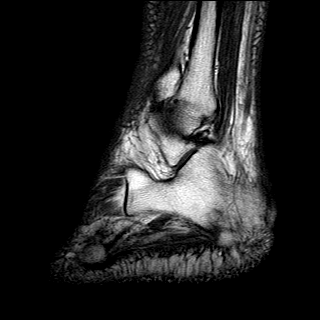
[im 10/20]
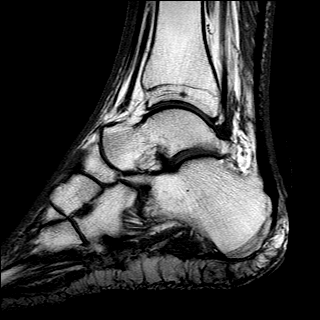
[im 15/20]
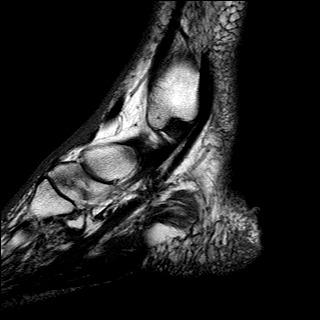
[im 20/20]
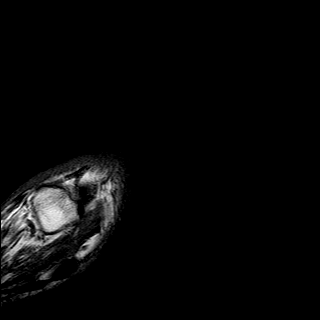

[Series 6: STIR · sagittal · 3.0mm · 0.29mm/px · 5 of 20 slices shown]
[im 1/20]
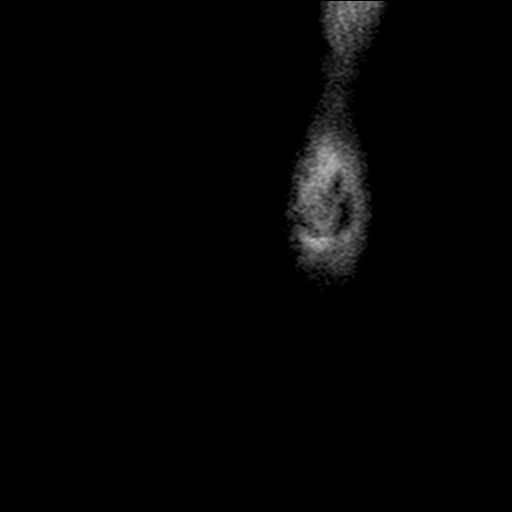
[im 5/20]
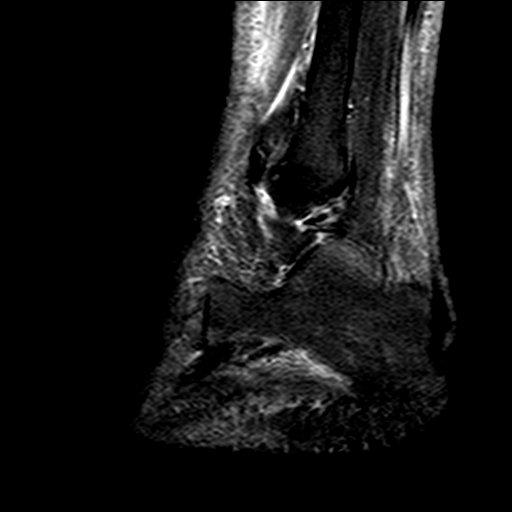
[im 10/20]
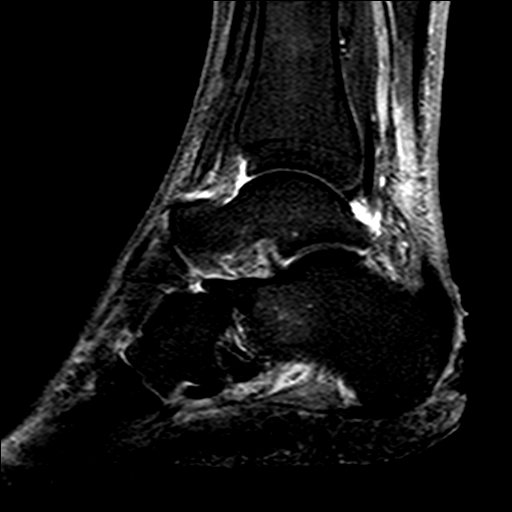
[im 15/20]
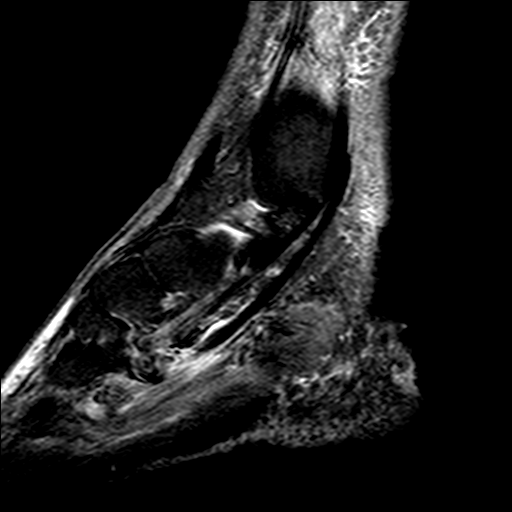
[im 20/20]
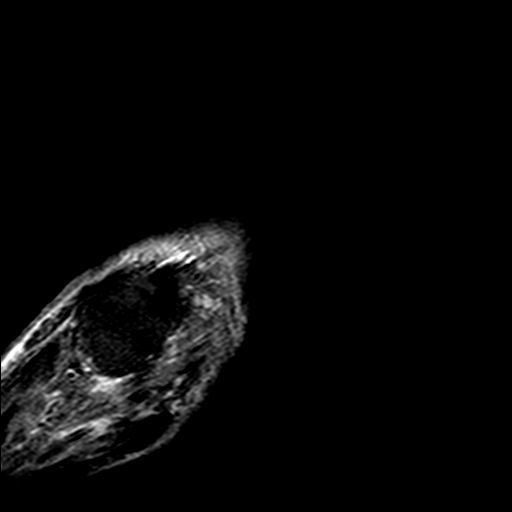

[Series 7: T1 · coronal · 4.0mm · 0.47mm/px · 5 of 20 slices shown (3 of 3)]
[im 1/20]
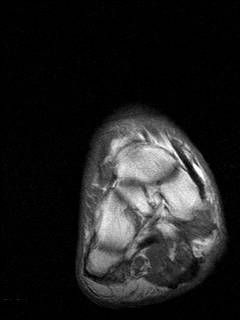
[im 5/20]
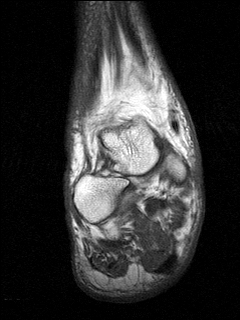
[im 10/20]
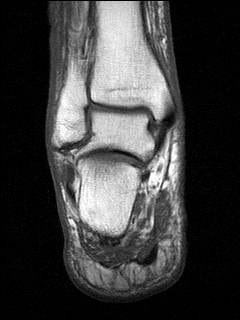
[im 15/20]
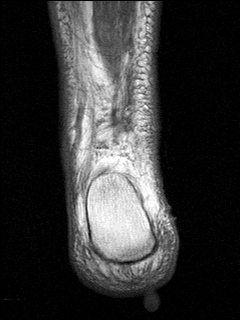
[im 20/20]
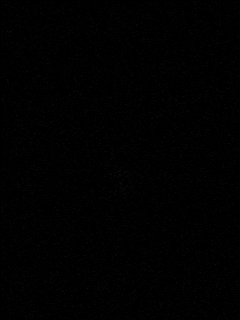

[Series 14: T1 fat-sat post-contrast · sagittal · 3.0mm · 0.59mm/px · 5 of 20 slices shown (1 of 2)]
[im 1/20]
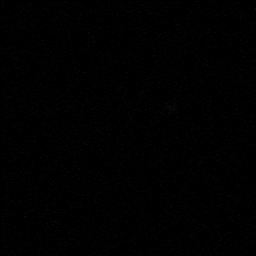
[im 5/20]
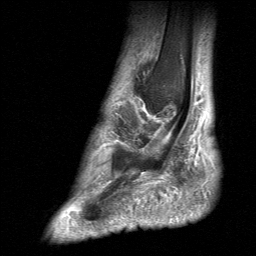
[im 10/20]
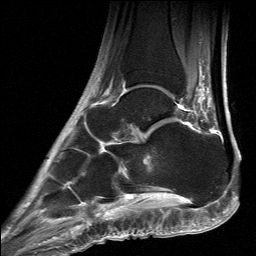
[im 15/20]
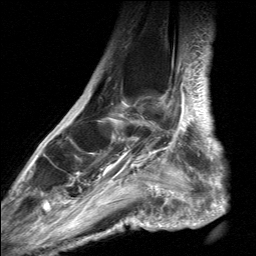
[im 20/20]
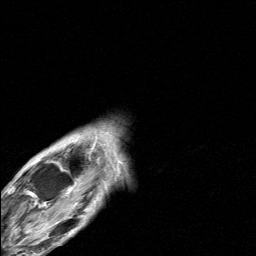

[Series 15: T1 fat-sat post-contrast · axial · 4.0mm · 0.59mm/px · z∈[-45,+74]mm · 6 of 25 slices shown (2 of 2)]
[im 1/25]
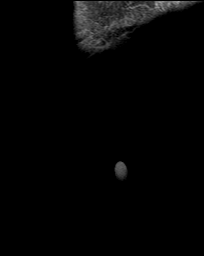
[im 5/25]
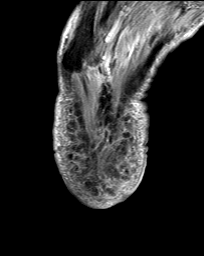
[im 10/25]
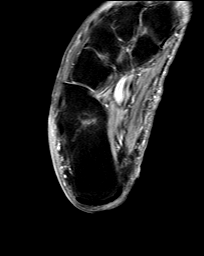
[im 15/25]
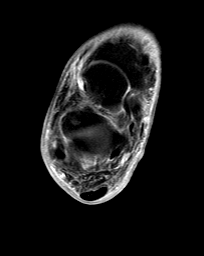
[im 20/25]
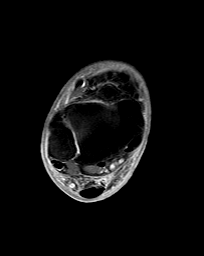
[im 25/25]
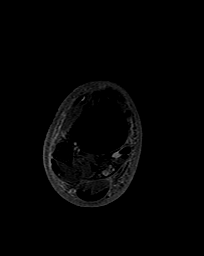

[37 of 40 positions shown; findings below may reference images not displayed]

DIAGNOSTIC STUDIES

EXAM

MRI of the right foot and ankle with without contrast.

INDICATION

DFU right great toe
DECUBITUS ULCERS IN DIABETIC PATIENT ON THE 1ST TOE AND HEEL OF RIGHT FOOT.  PAINFUL, OOZING.  MRSA
POSITIVE.  NO ARTERIAL FLOW TO LOWER EXTREMITIES ON US.  15 ML GADAVIST RG

TECHNIQUE

Sagittal, axial, and coronal images were obtained with variable T1 and T2 weighting before after
administration of intravenous contrast.

COMPARISONS

None available

FINDINGS

Exam is compromised due to motion. The visualized flexor and extensor tendons are normal.

There is marrow edema and enhancement involving the DIP joint and distal portion of the proximal
phalanx of the 1st digit with associated soft tissue edema. The appearance likely indicates early
osteomyelitis. No organized fluid or drainable fluid collection is seen.

Evaluation of the calcaneus demonstrates no evidence for osteomyelitis. There is soft tissue edema
overlying the calcaneal tuberosity.

Degenerative changes of the tarsometatarsal and mid tarsal joint spaces are noted.

IMPRESSION

Findings consistent with early osteomyelitis of the 1st digit as described. No abscess or drainable
fluid collection is seen.

Soft tissue edema overlying the calcaneus without evidence for osteomyelitis of the calcaneus.

Tech Notes:

DECUBITUS ULCERS IN DIABETIC PATIENT ON THE 1ST TOE AND HEEL OF RIGHT FOOT.  PAINFUL, OOZING.  MRSA
POSITIVE.  NO ARTERIAL FLOW TO LOWER EXTREMITIES ON US.  15 ML GADAVIST RG

## 2021-08-14 IMAGING — MR Foot^Routine
8 series · 40 of 40 positions shown · non-contrast
Comparison: none

[Series 3: PD fat-sat · oblique · 4.5mm · 0.47mm/px · 5 of 25 slices shown]
[im 1/25]
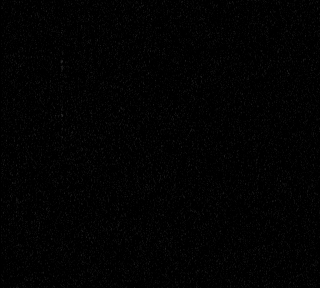
[im 7/25]
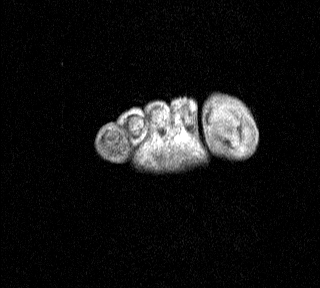
[im 13/25]
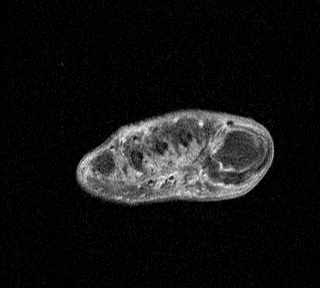
[im 19/25]
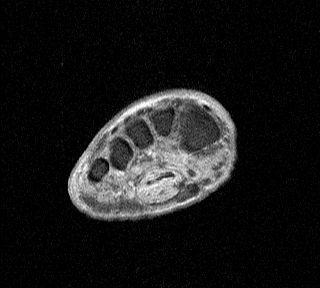
[im 25/25]
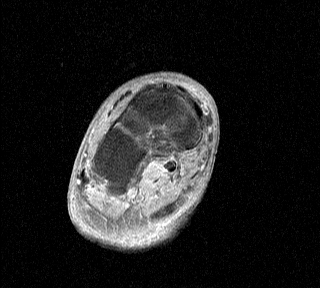

[Series 5: STIR · oblique · 4.0mm · 1.02mm/px · 5 of 20 slices shown (1 of 2)]
[im 1/20]
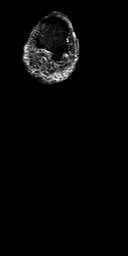
[im 5/20]
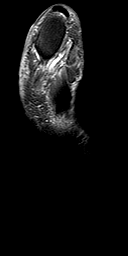
[im 10/20]
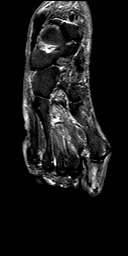
[im 15/20]
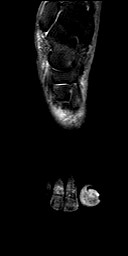
[im 20/20]
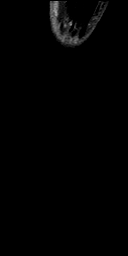

[Series 6: T1 · sagittal · 3.5mm · 0.69mm/px · 5 of 22 slices shown (1 of 3)]
[im 1/22]
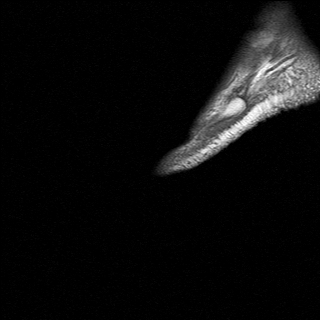
[im 6/22]
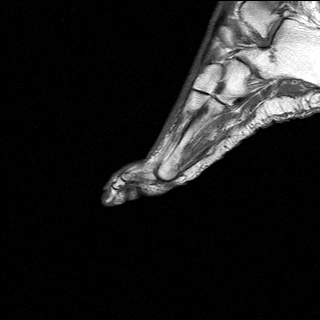
[im 11/22]
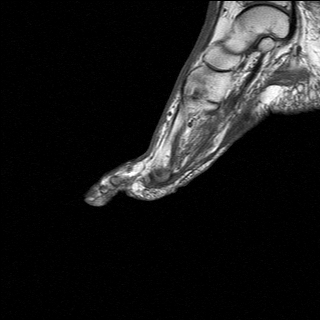
[im 16/22]
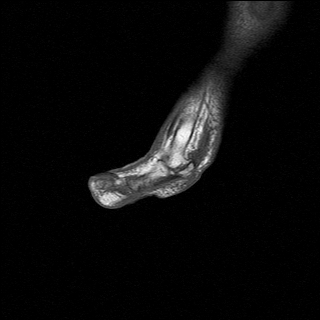
[im 22/22]
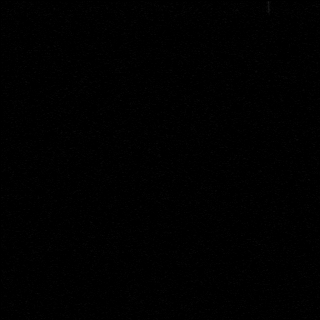

[Series 7: T1 · oblique · 4.0mm · 0.78mm/px · 5 of 20 slices shown (2 of 3)]
[im 1/20]
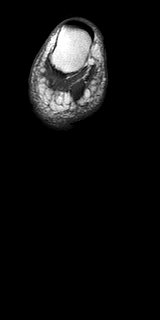
[im 5/20]
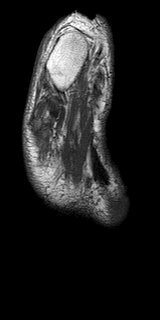
[im 10/20]
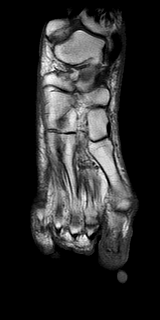
[im 15/20]
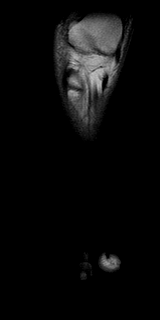
[im 20/20]
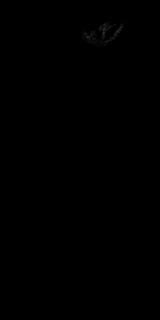

[Series 8: STIR · sagittal · 4.0mm · 0.86mm/px · 4 of 18 slices shown (2 of 2)]
[im 1/18]
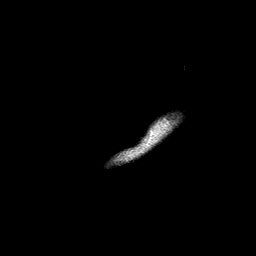
[im 6/18]
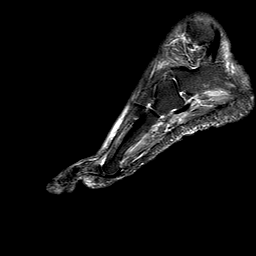
[im 12/18]
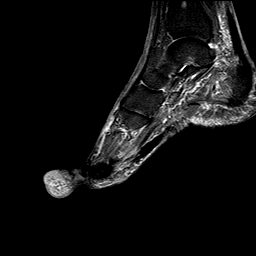
[im 18/18]
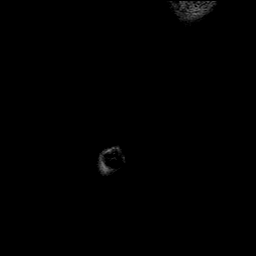

[Series 9: T1 · oblique · 4.0mm · 0.81mm/px · 5 of 20 slices shown (3 of 3)]
[im 1/20]
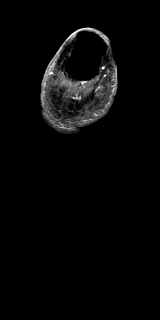
[im 5/20]
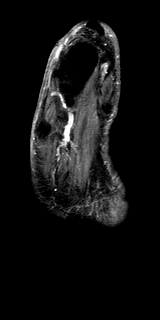
[im 10/20]
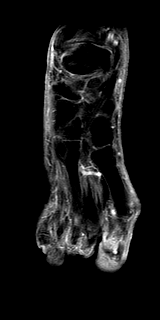
[im 15/20]
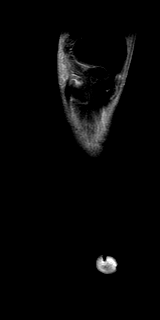
[im 20/20]
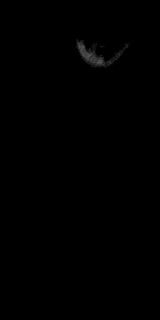

[Series 10: T1 fat-sat post-contrast · oblique · 3.5mm · 0.86mm/px · 5 of 22 slices shown (1 of 2)]
[im 1/22]
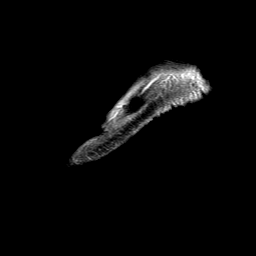
[im 6/22]
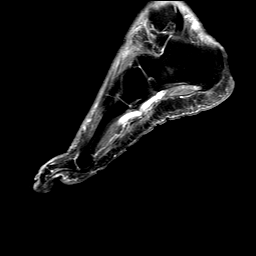
[im 11/22]
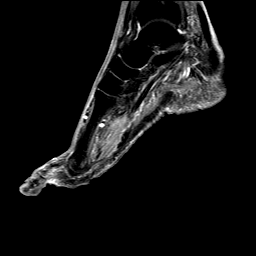
[im 16/22]
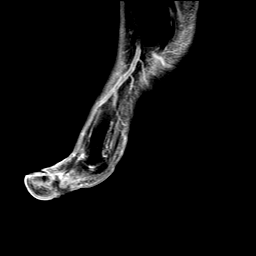
[im 22/22]
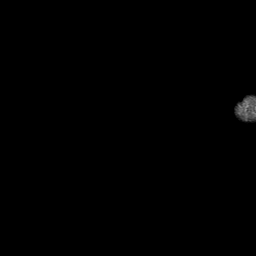

[Series 11: T1 fat-sat post-contrast · oblique · 4.5mm · 0.59mm/px · 6 of 25 slices shown (2 of 2)]
[im 1/25]
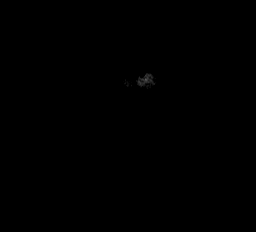
[im 5/25]
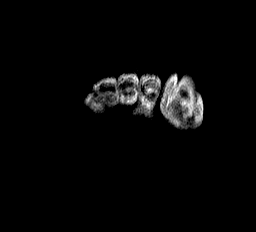
[im 10/25]
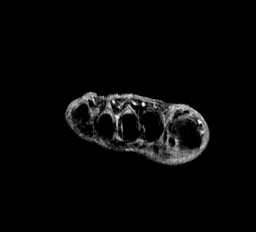
[im 15/25]
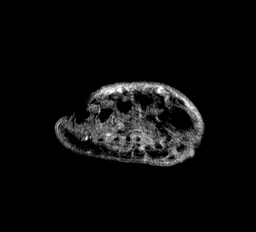
[im 20/25]
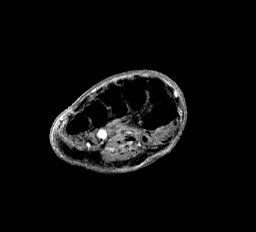
[im 25/25]
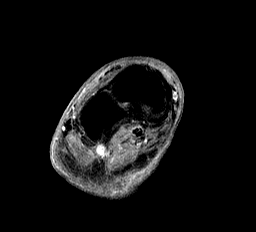

[40 of 40 positions shown; findings below may reference images not displayed]

DIAGNOSTIC STUDIES

EXAM

MRI of the right foot and ankle with without contrast.

INDICATION

DFU right great toe
DECUBITUS ULCERS IN DIABETIC PATIENT ON THE 1ST TOE AND HEEL OF RIGHT FOOT.  PAINFUL, OOZING.  MRSA
POSITIVE.  NO ARTERIAL FLOW TO LOWER EXTREMITIES ON US.  15 ML GADAVIST RG

TECHNIQUE

Sagittal, axial, and coronal images were obtained with variable T1 and T2 weighting before after
administration of intravenous contrast.

COMPARISONS

None available

FINDINGS

Exam is compromised due to motion. The visualized flexor and extensor tendons are normal.

There is marrow edema and enhancement involving the DIP joint and distal portion of the proximal
phalanx of the 1st digit with associated soft tissue edema. The appearance likely indicates early
osteomyelitis. No organized fluid or drainable fluid collection is seen.

Evaluation of the calcaneus demonstrates no evidence for osteomyelitis. There is soft tissue edema
overlying the calcaneal tuberosity.

Degenerative changes of the tarsometatarsal and mid tarsal joint spaces are noted.

IMPRESSION

Findings consistent with early osteomyelitis of the 1st digit as described. No abscess or drainable
fluid collection is seen.

Soft tissue edema overlying the calcaneus without evidence for osteomyelitis of the calcaneus.

Tech Notes:

DECUBITUS ULCERS IN DIABETIC PATIENT ON THE 1ST TOE AND HEEL OF RIGHT FOOT.  PAINFUL, OOZING.  MRSA
POSITIVE.  NO ARTERIAL FLOW TO LOWER EXTREMITIES ON US.  15 ML GADAVIST RG

## 2021-08-16 IMAGING — MR Foot^Routine
10 series · 40 of 40 positions shown · non-contrast
Comparison: none

[Series 3: PD fat-sat · axial · 4.5mm · 0.47mm/px · z∈[-49,+75]mm · 4 of 23 slices shown (1 of 2)]
[im 1/23]
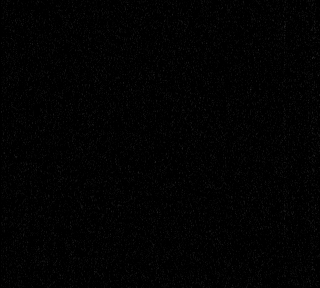
[im 8/23]
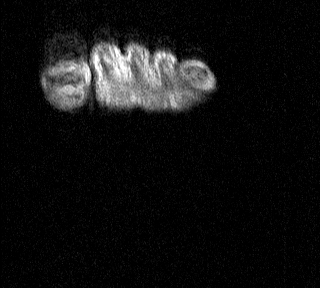
[im 15/23]
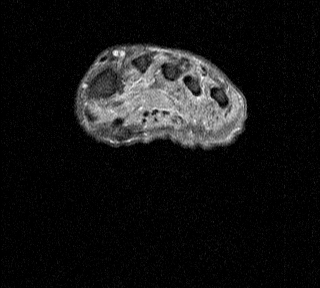
[im 23/23]
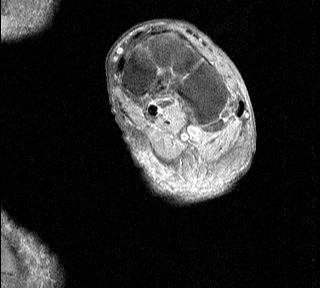

[Series 4: T1 · oblique · 4.0mm · 0.81mm/px · 4 of 20 slices shown (1 of 4)]
[im 1/20]
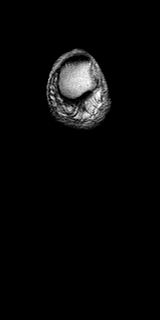
[im 7/20]
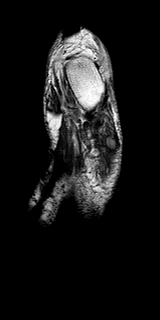
[im 13/20]
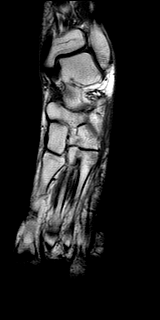
[im 20/20]
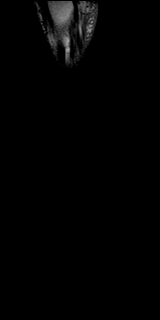

[Series 5: STIR · coronal · 4.0mm · 0.98mm/px · 4 of 19 slices shown (1 of 2)]
[im 1/19]
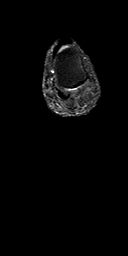
[im 7/19]
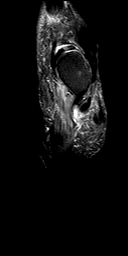
[im 13/19]
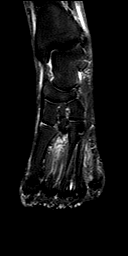
[im 19/19]
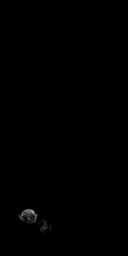

[Series 6: T1 · oblique · 4.0mm · 0.69mm/px · 3 of 18 slices shown (2 of 4)]
[im 1/18]
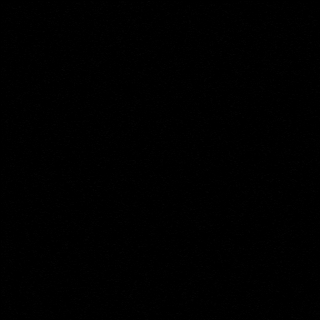
[im 9/18]
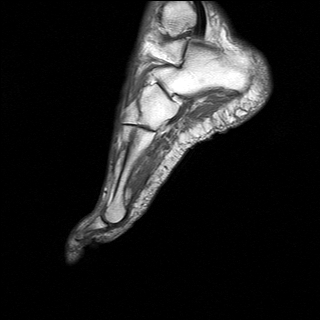
[im 18/18]
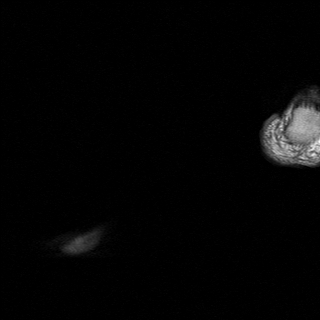

[Series 7: STIR · sagittal · 4.0mm · 0.86mm/px · 4 of 22 slices shown (2 of 2)]
[im 1/22]
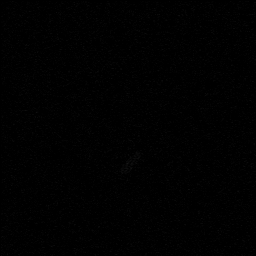
[im 8/22]
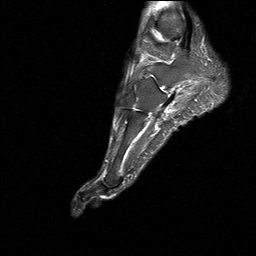
[im 15/22]
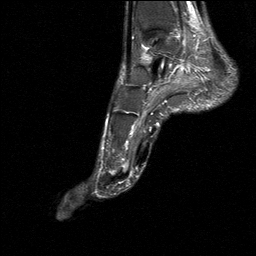
[im 22/22]
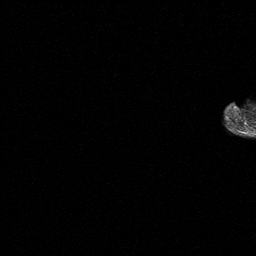

[Series 8: T1 · oblique · 4.0mm · 0.69mm/px · 4 of 22 slices shown (3 of 4)]
[im 1/22]
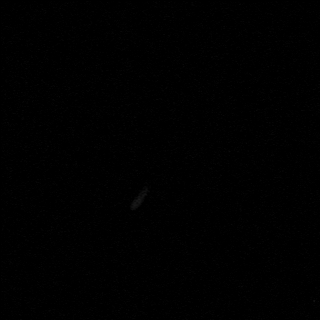
[im 8/22]
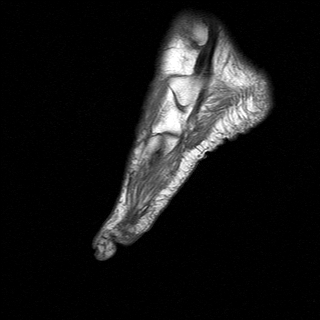
[im 15/22]
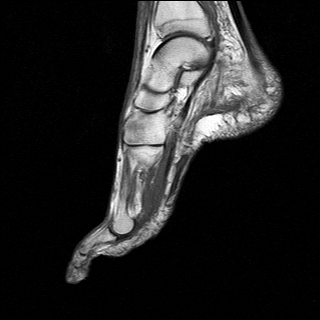
[im 22/22]
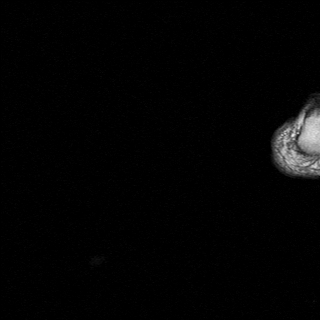

[Series 9: PD fat-sat · oblique · 4.0mm · 0.62mm/px · 4 of 20 slices shown (2 of 2)]
[im 1/20]
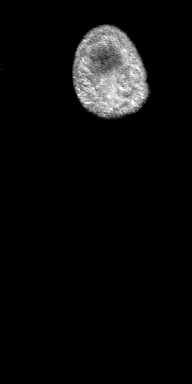
[im 7/20]
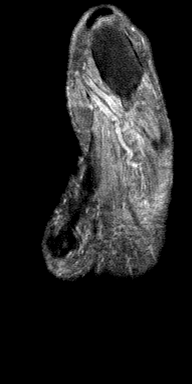
[im 13/20]
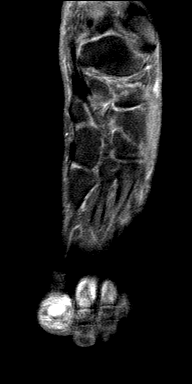
[im 20/20]
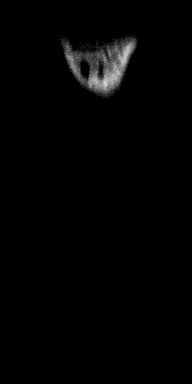

[Series 10: T1 · oblique · 4.0mm · 0.81mm/px · 4 of 20 slices shown (4 of 4)]
[im 1/20]
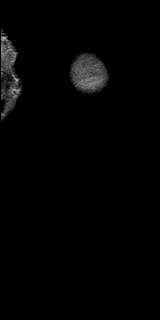
[im 7/20]
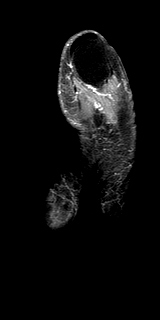
[im 13/20]
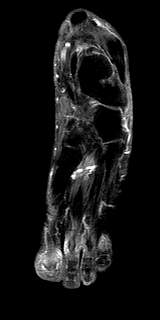
[im 20/20]
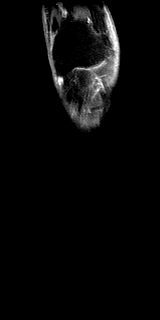

[Series 11: T1 fat-sat post-contrast · sagittal · 4.0mm · 0.86mm/px · 4 of 22 slices shown (1 of 2)]
[im 1/22]
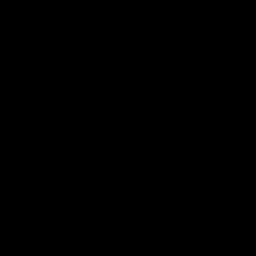
[im 8/22]
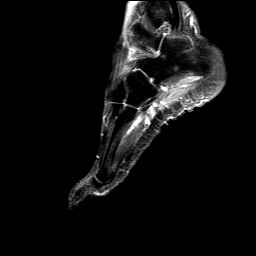
[im 15/22]
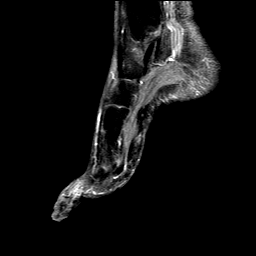
[im 22/22]
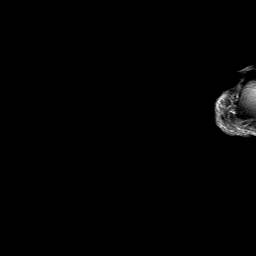

[Series 12: T1 fat-sat post-contrast · axial · 4.5mm · 0.59mm/px · z∈[-28,+87]mm · 5 of 25 slices shown (2 of 2)]
[im 1/25]
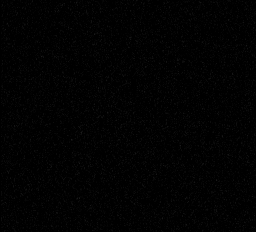
[im 7/25]
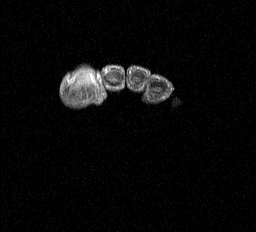
[im 13/25]
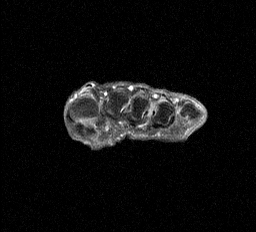
[im 19/25]
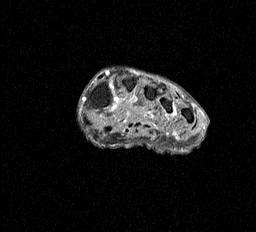
[im 25/25]
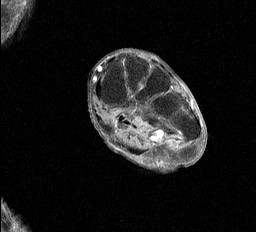

[40 of 40 positions shown; findings below may reference images not displayed]

EXAM

MR foot LT wo/w con

INDICATION

DU
PRESSURE ULCER IN LEFT 5TH MTP MARKED WITH ETAB.  REDUCED ARTERIAL FLOW TO LOWER EXTREMITIES,
DIABETIC.  15 ML GADAVIST RG

TECHNIQUE

MR foot LT wo/w con

COMPARISONS

None available

FINDINGS

The region of interest of the pressure ulcer along the 5th metatarsophalangeal joint is marked.
There is no adjacent abscess. The underlying marrow signal at the 5th MTP joint is normal and there
is no increased enhancement to suggest osteomyelitis. The 1st distal phalanx demonstrates normal T1
signal, some increased signal on the postcontrast sequences may relate to inhomogeneous fat
saturation. There is otherwise normal marrow signal and there is no evidence of osteomyelitis of the
left foot. No fracture or dislocation. The joint spaces are maintained.

IMPRESSION

No evidence of abscess or osteomyelitis.

Tech Notes:

PRESSURE ULCER IN LEFT 5TH MTP MARKED WITH ETAB.  REDUCED ARTERIAL FLOW TO LOWER EXTREMITIES,
DIABETIC.  15 ML GADAVIST RG

## 2021-09-13 IMAGING — CR [ID]
2 series · 2 of 2 positions shown · non-contrast
Comparison: none

[hip ap]
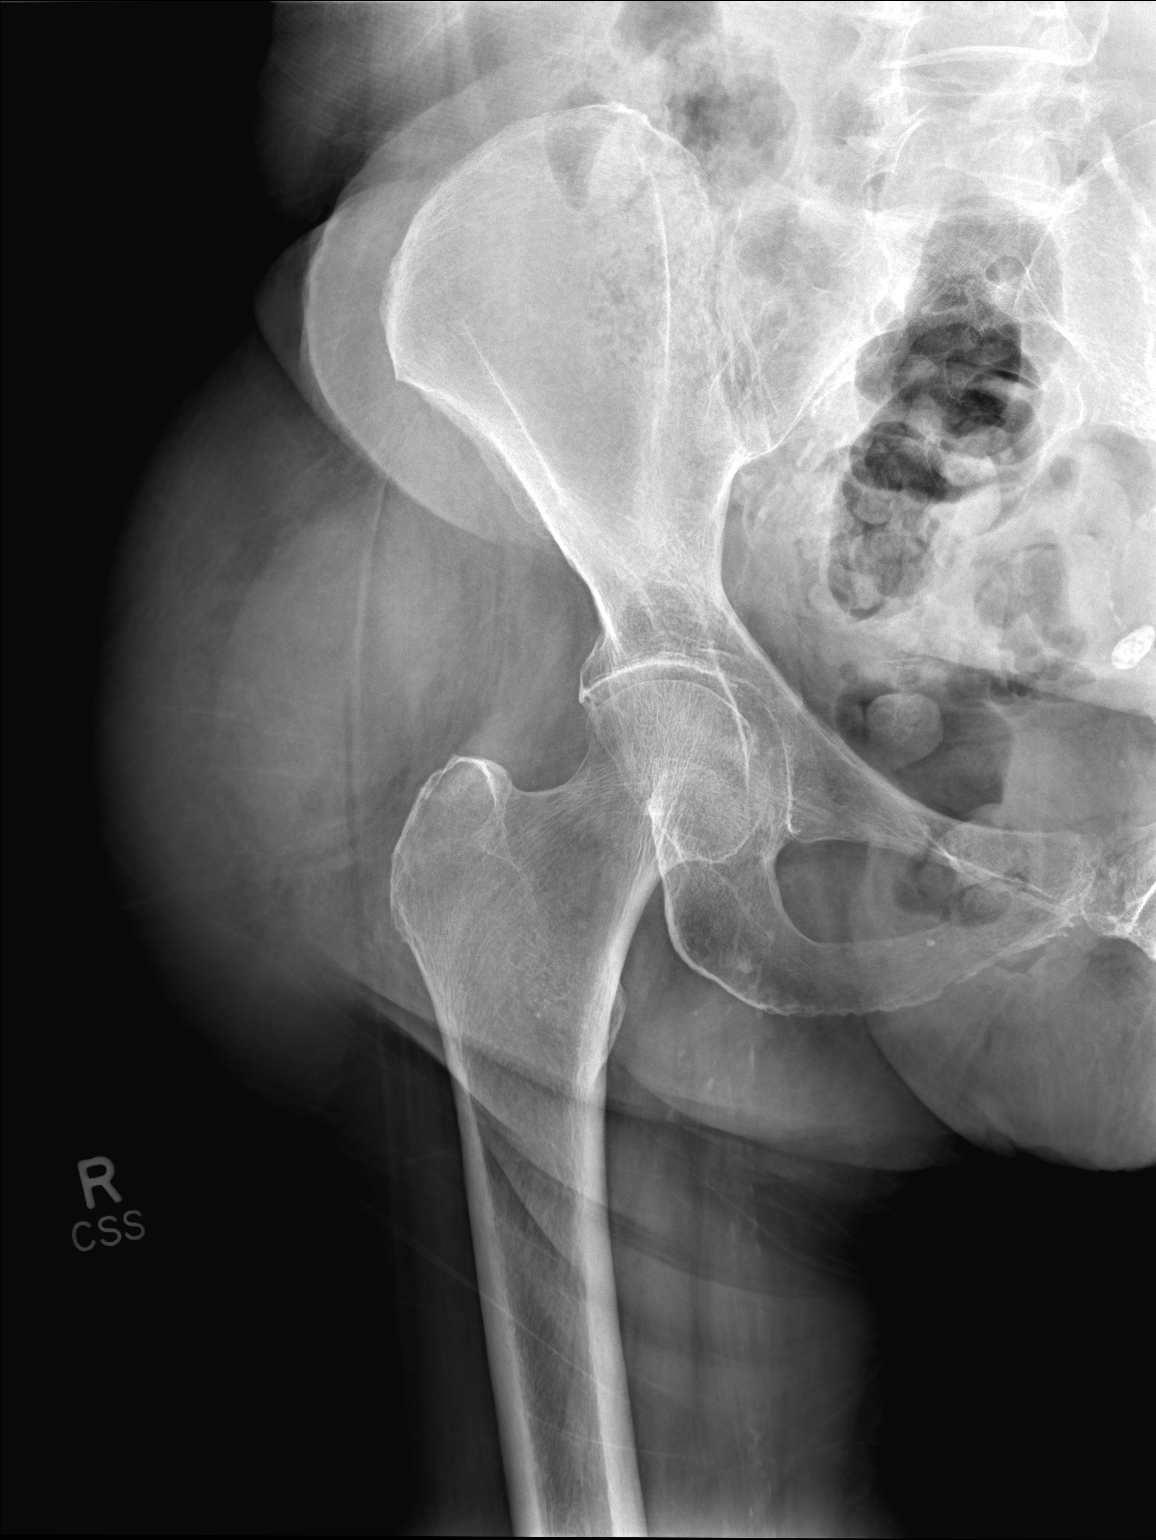

[hip frog lat]
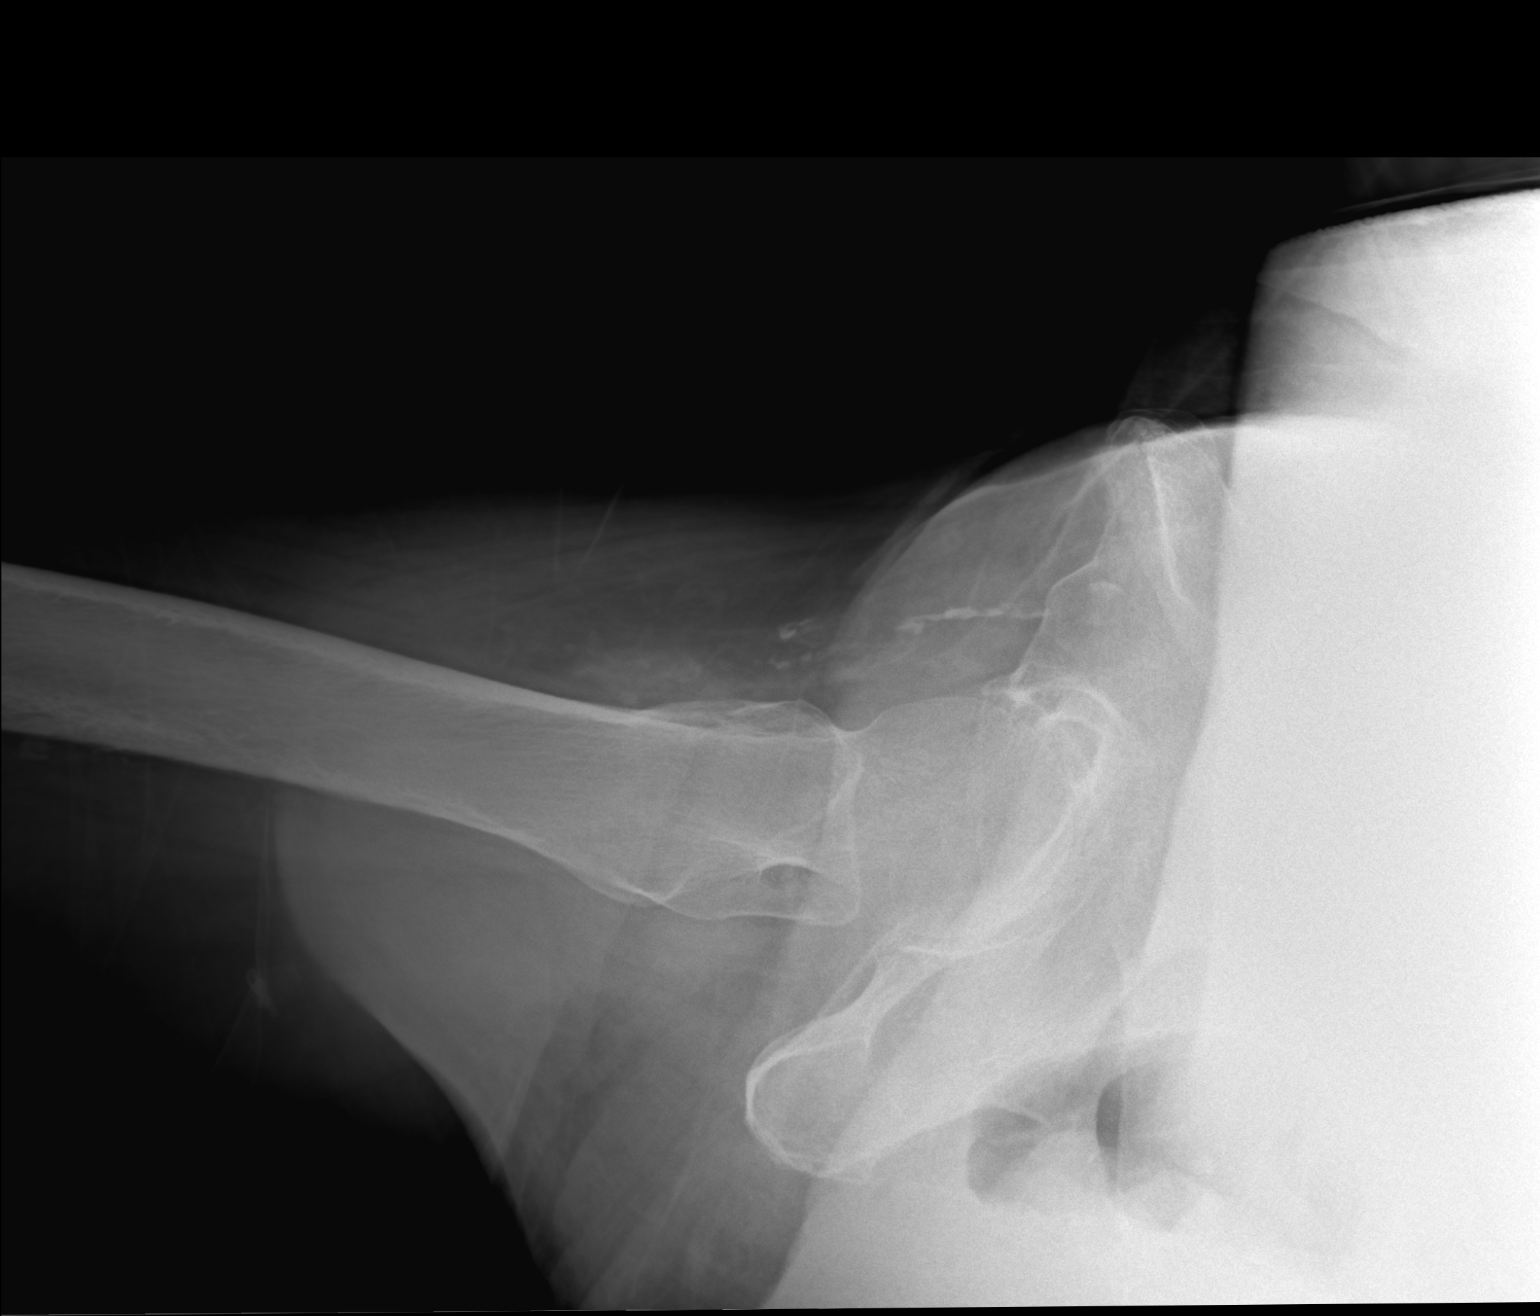

[2 of 2 positions shown; findings below may reference images not displayed]

DIAGNOSTIC STUDIES

EXAM

XR hip RT, 2-3V w or wo pelvis

INDICATION

soft tissue mass/swelling right hip
Right hip abcess. CS

TECHNIQUE

XR hip RT, 2-3V w or wo pelvis

COMPARISONS

None

FINDINGS

No acute fracture or dislocation. Mild narrowing of the right hip joint. Right lateral hip soft
tissue swelling is noted

IMPRESSION

No acute osseous findings. Swelling of the soft tissues overlying the right lateral hip.

Tech Notes:

Right hip abcess. CS

## 2021-11-08 IMAGING — US STABDBACK
1 series · 11 of 11 positions shown · non-contrast
Comparison: none

[Series 1: us soft tissue abd and back · 11 of 11 slices shown]
[im 1/11]
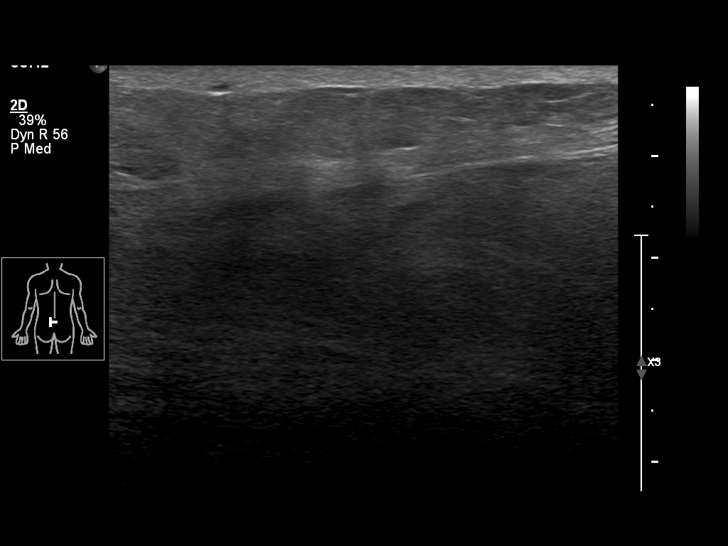
[im 2/11]
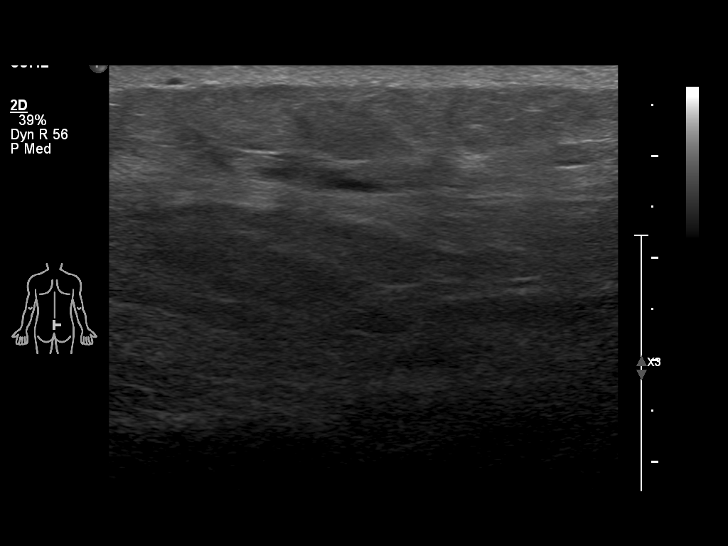
[im 3/11]
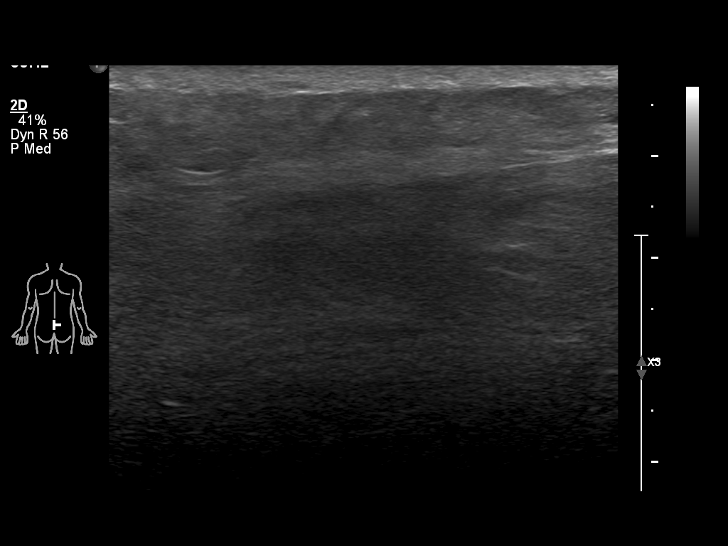
[im 4/11]
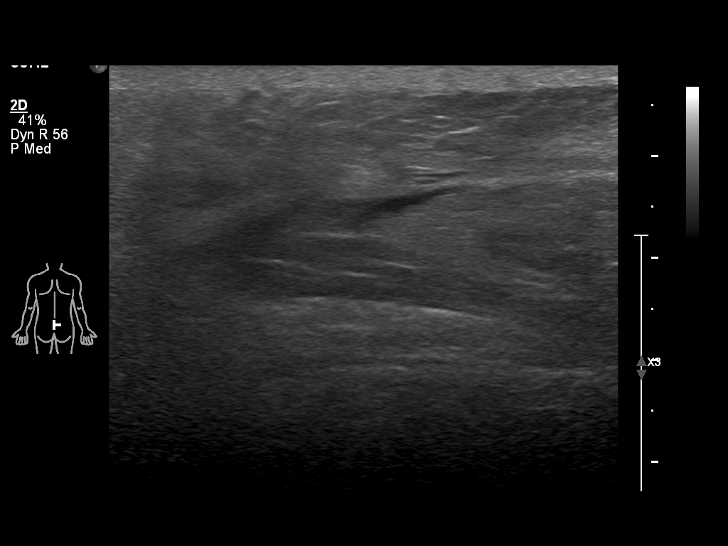
[im 5/11]
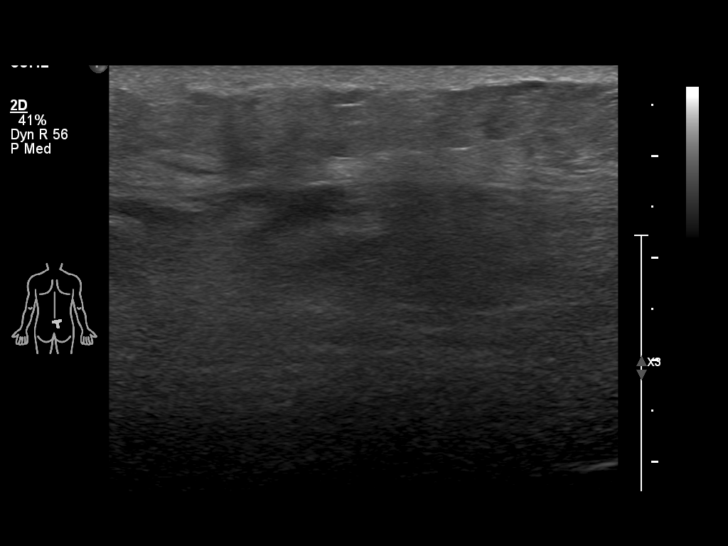
[im 6/11]
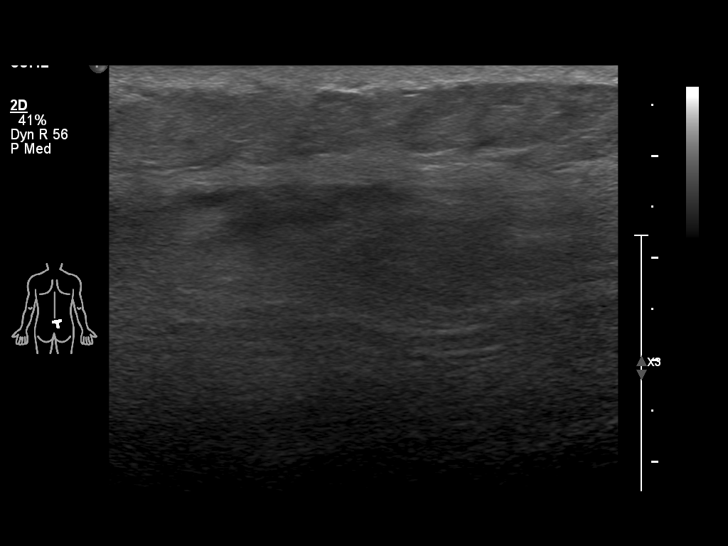
[im 7/11]
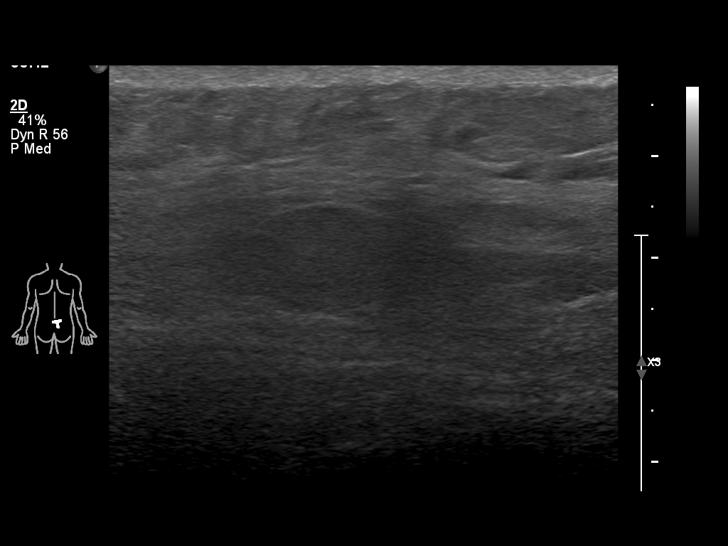
[im 8/11]
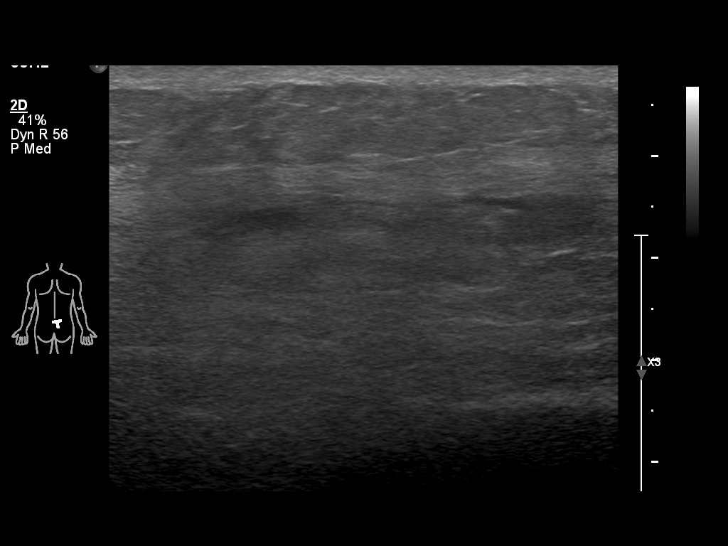
[im 9/11]
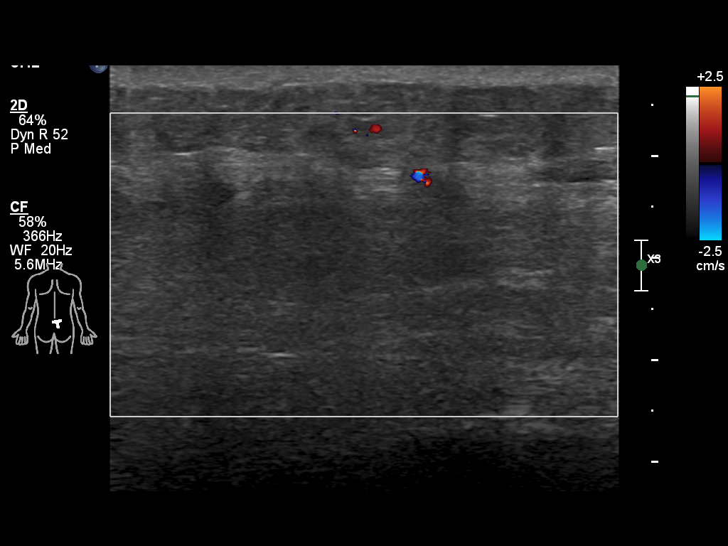
[im 10/11]
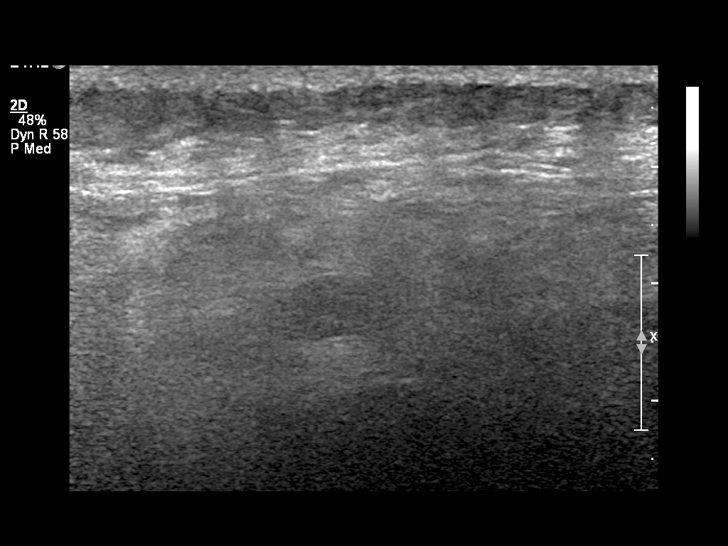
[im 11/11]
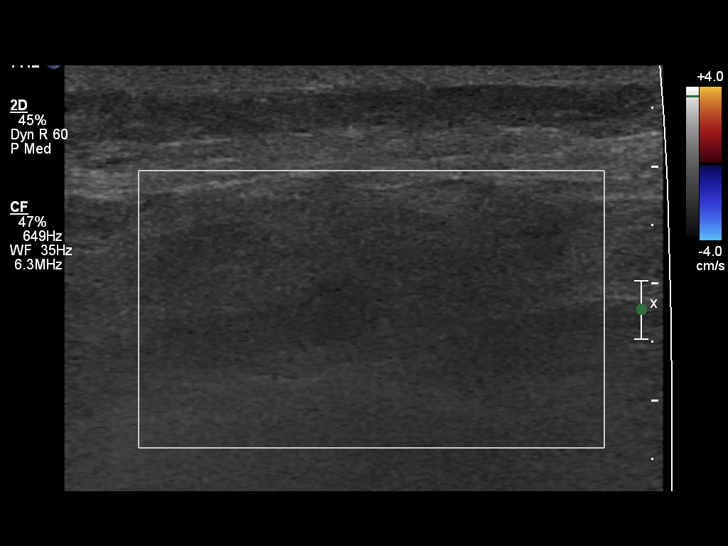

[11 of 11 positions shown; findings below may reference images not displayed]

DIAGNOSTIC STUDIES

EXAM

Soft tissue ultrasound the right gluteal region.

INDICATION

localized swelling,mass, right hip/buttock area

TECHNIQUE

Ultrasound patient's palpable abnormality was obtained.

COMPARISONS

None available

FINDINGS

No abnormal fluid collections or masses are seen. Conceivably a small lipoma could be present and
obscured by subcutaneous fat.

IMPRESSION

Negative right gluteal ultrasound. Please see above discussion.

Tech Notes:

## 2021-11-29 IMAGING — MR MR FOOT^[PERSON_NAME] FOOT
1 series · 3 of 3 positions shown · IV contrast (with contrast)
Comparison: none

[Series 20: T1 post-contrast · sagittal · 4.0mm · 0.47mm/px · 3 of 3 slices shown]
[im 1/3]
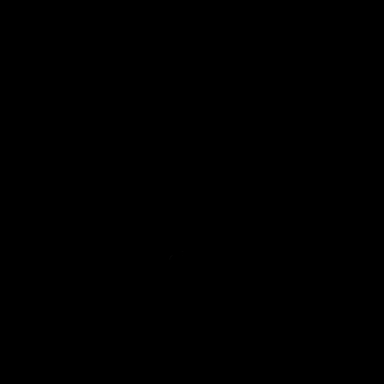
[im 2/3]
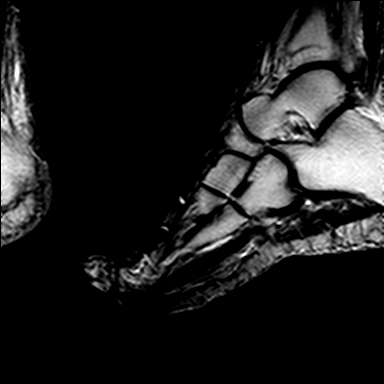
[im 3/3]
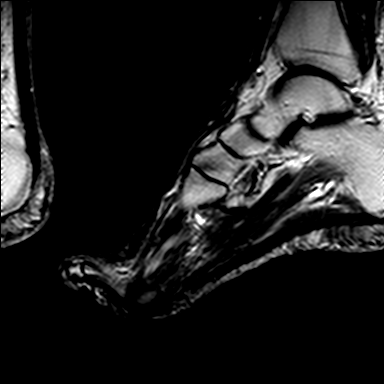

[3 of 3 positions shown; findings below may reference images not displayed]

EXAM

MRI left foot without and with contrast

INDICATION

DFU left 5th metatarsal
FOOT ULCER TO LATERAL 5TH MTP, MARKED, BANDAGED UP FOR 4 MONTHS.  15 ML GADAVIST RG

FINDINGS

Sagittal T1 and fat suppressed T2, coronal T1 and fat suppressed T2, axial T1 and fat suppressed T2
and axial sagittal and coronal post-contrast fat suppressed T1 weighted images of the left foot were
obtained. An IV dose of 15 mL of Gadavist was administered.

There is no marrow signal abnormality within the metatarsals. There is soft tissue ulceration
laterally and dorsally adjacent to the 5th metatarsal.

No contained or rim enhancing fluid collection is seen within the soft tissues.

There is soft tissue edema and enhancement of the midfoot and forefoot most likely representing
cellulitis.

IMPRESSION

There is no MRI evidence of osteomyelitis of the left foot. There is evidence of soft tissue
ulceration near the 5th metatarsal.

Tech Notes:

FOOT ULCER TO LATERAL 5TH MTP, MARKED, BANDAGED UP FOR 4 MONTHS.  15 ML GADAVIST RG

## 2022-04-05 IMAGING — MR FOOTLTWW
7 of 11 series · 30 of 40 positions shown · non-contrast
Comparison: none

[Series 3: T1 · axial · left · 3.0mm · 0.45mm/px · z∈[-51,+20]mm · 3 of 25 slices shown (1 of 3)]
[im 1/25]
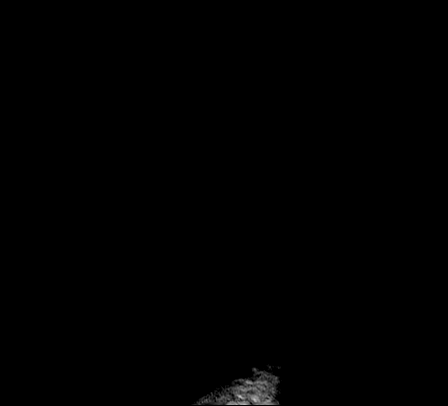
[im 13/25]
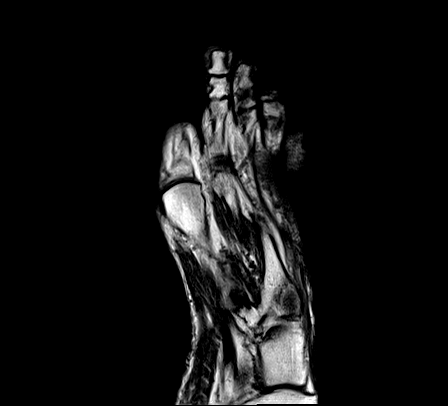
[im 25/25]
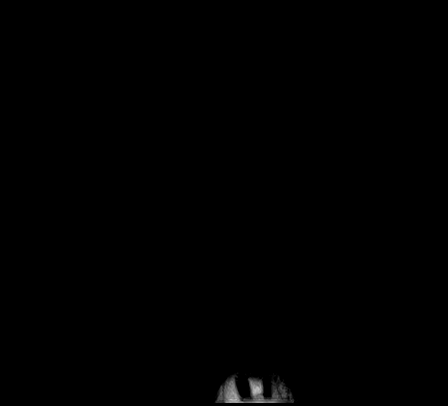

[Series 4: PD fat-sat · coronal · left · 3.0mm · 0.44mm/px · 5 of 40 slices shown]
[im 1/40]
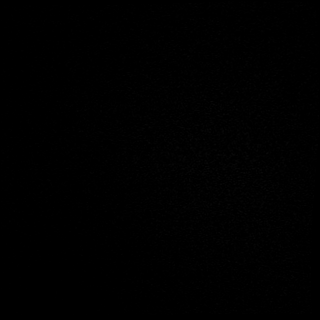
[im 10/40]
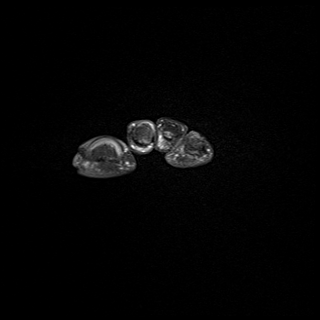
[im 20/40]
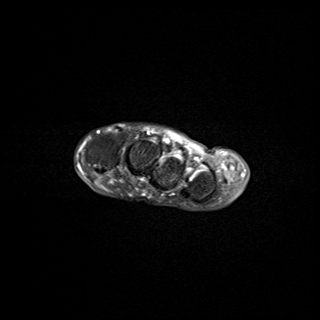
[im 30/40]
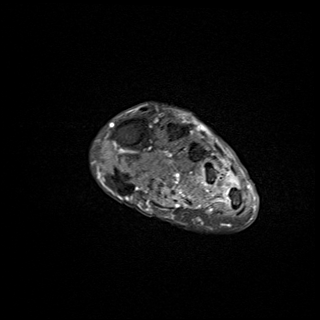
[im 40/40]
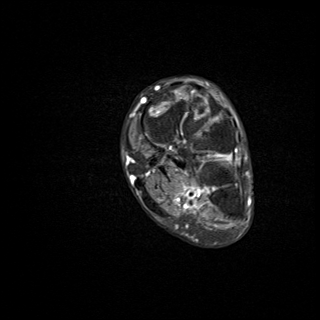

[Series 7: T1 · sagittal · left · 3.0mm · 0.36mm/px · 5 of 36 slices shown (2 of 3)]
[im 1/36]
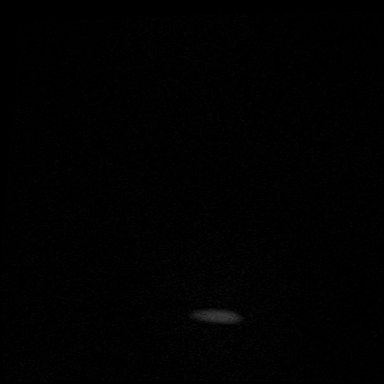
[im 9/36]
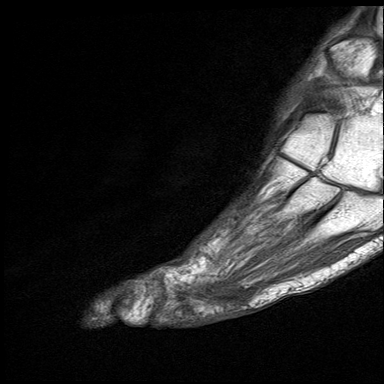
[im 18/36]
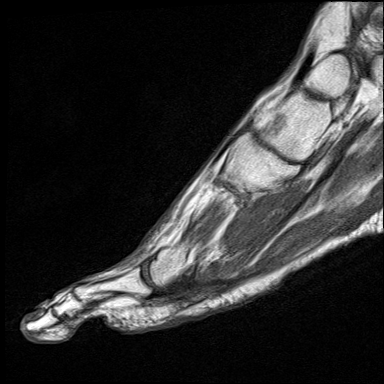
[im 27/36]
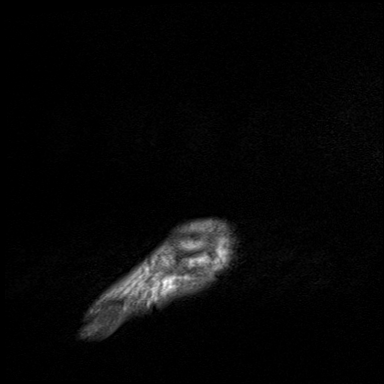
[im 36/36]
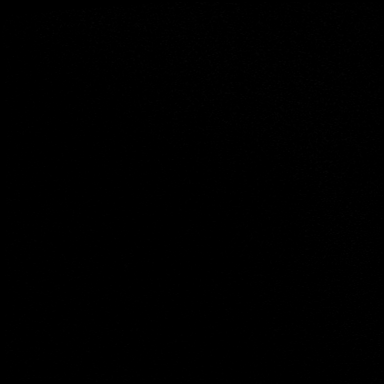

[Series 8: T1 · coronal · left · 3.0mm · 0.36mm/px · 5 of 36 slices shown (3 of 3)]
[im 1/36]
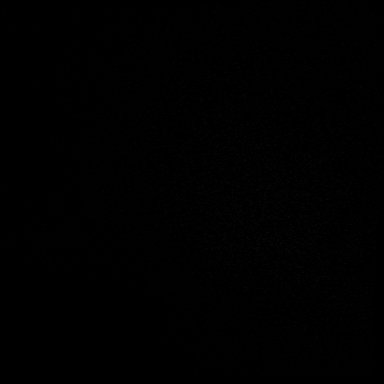
[im 9/36]
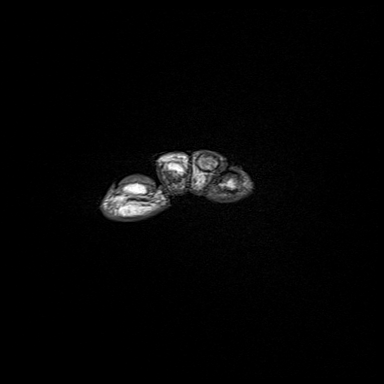
[im 18/36]
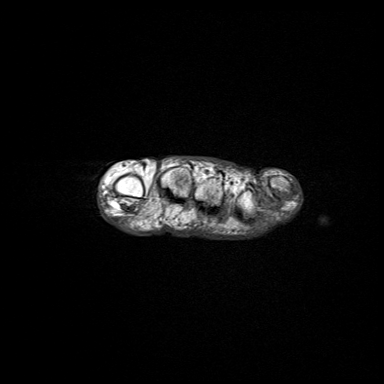
[im 27/36]
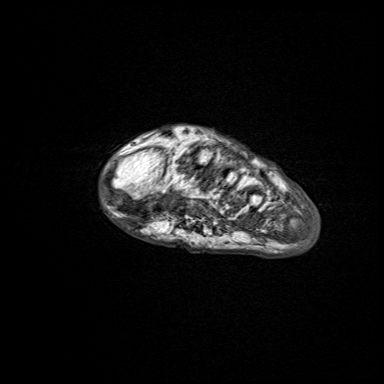
[im 36/36]
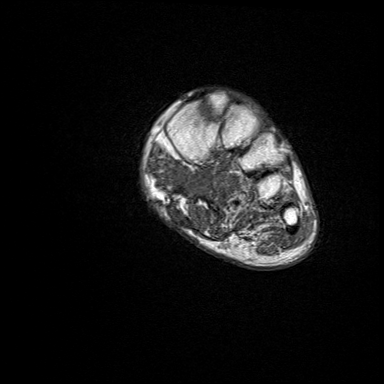

[Series 10: T1 fat-sat post-contrast · axial · left · 3.0mm · 0.40mm/px · z∈[-55,+16]mm · 3 of 25 slices shown (1 of 3)]
[im 1/25]
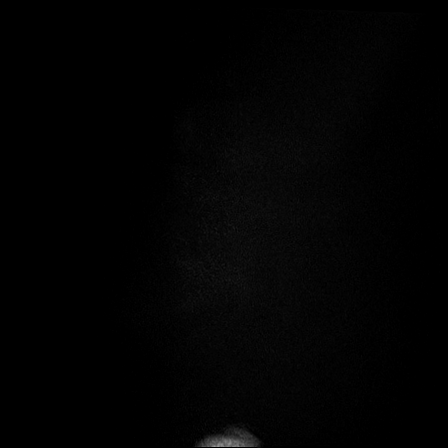
[im 13/25]
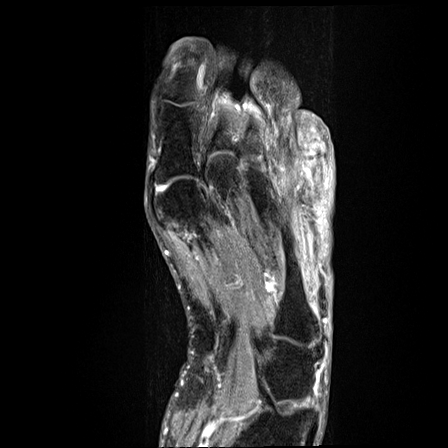
[im 25/25]
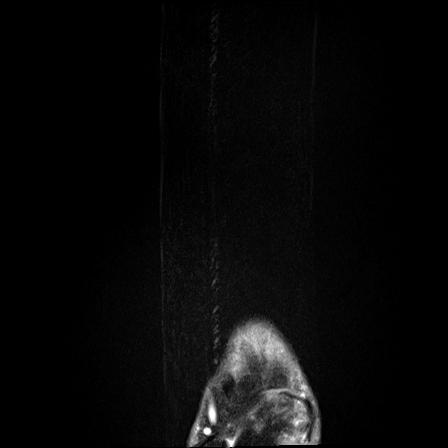

[Series 11: T1 fat-sat post-contrast · sagittal · left · 3.0mm · 0.46mm/px · 5 of 36 slices shown (2 of 3)]
[im 1/36]
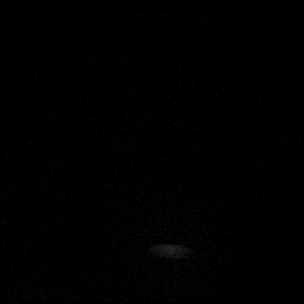
[im 9/36]
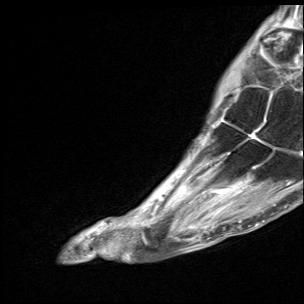
[im 18/36]
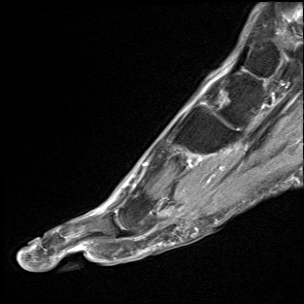
[im 27/36]
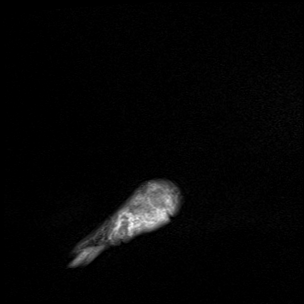
[im 36/36]
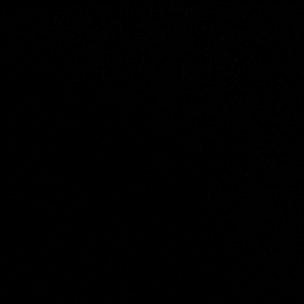

[Series 13: T1 fat-sat post-contrast · coronal · left · 3.0mm · 0.23mm/px · 4 of 27 slices shown (3 of 3)]
[im 1/27]
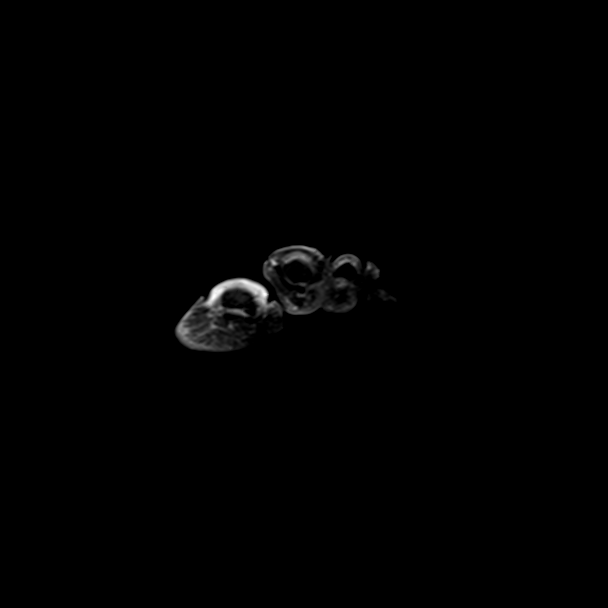
[im 9/27]
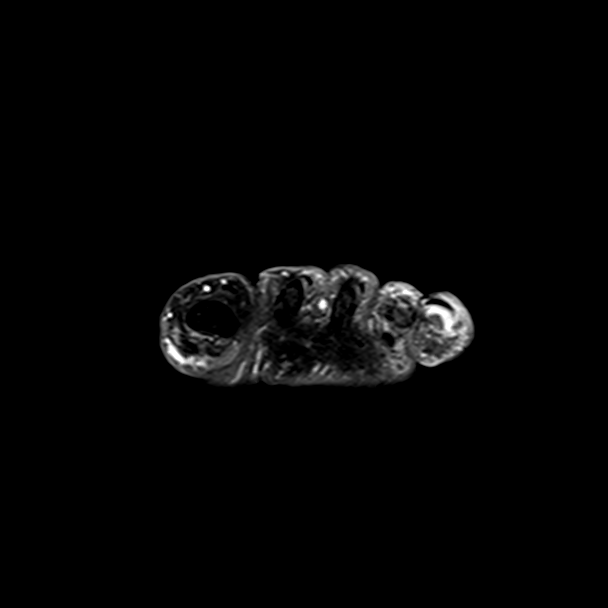
[im 18/27]
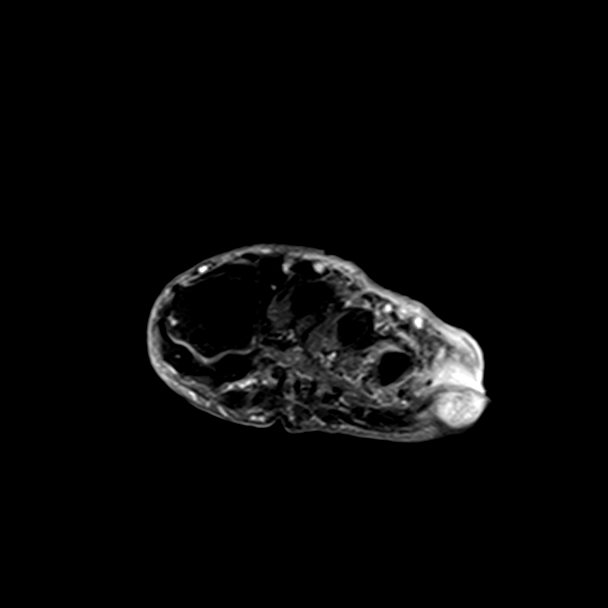
[im 27/27]
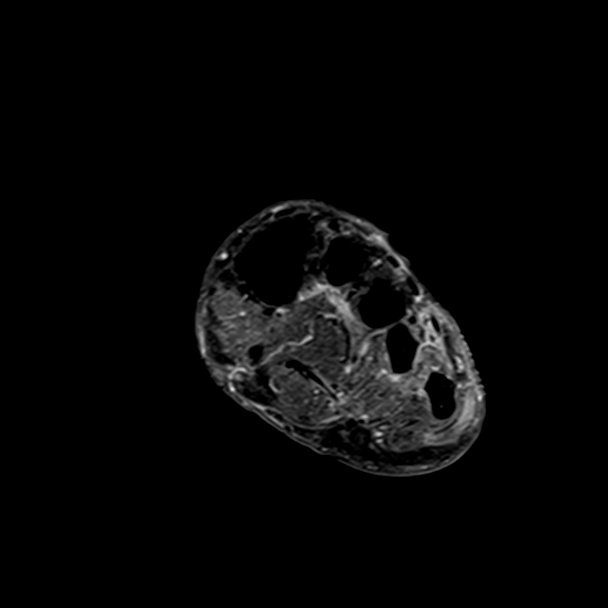

[30 of 40 positions shown; findings below may reference images not displayed]

EXAM

MRI left foot without and with contrast

INDICATION

DFU left foot, exposed bone
LEFT LATERAL DFU TO 5TH MTP, MARKED WITH ETAB.  STARTED IN August 2021, INCREASING IN SEVERITY.  HX PT
DIABETIC, RT BELOW KNEE AMPUTATION DUE TO OSTEO.  15 ML GADAVIST RG

FINDINGS

Coronal T1 and STIR, sagittal T1 and stir, axial T1 fat suppressed proton density and axial,
sagittal and coronal post-contrast fat suppressed T1 weighted images of the left foot were obtained.
An IV dose of 15 mL of Gadavist was administered.

Comparison is from 11/29/21.

There is a large soft tissue ulceration with open wound over the 5th metatarsal head. There is a
large area of exposure of bone to the wound surface. There is marrow edema and enhancement
throughout the 5th metatarsal head and neck. There is also marrow edema and enhancement throughout
the majority of the proximal phalanx of the 5th toe. There is less extensive marrow edema of the
middle phalanx. There is enhancement of the middle and distal phalanges of the 5th toe.

No rim enhancing fluid collection is identified. No abscess is identified.

IMPRESSION

There is a large open wound over the 5th metatarsal head with exposure of bone to the surface of the
wound. There is osteomyelitis throughout the 5th metatarsal head and neck and throughout the
proximal phalanx of the 5th toe. There is questionable developing osteomyelitis of the middle and
distal phalanges of the 5th toe.

No abscess is identified.

Tech Notes:

LEFT LATERAL DFU TO 5TH MTP, MARKED WITH ETAB.  STARTED IN August 2021, INCREASING IN SEVERITY.  HX PT
DIABETIC, RT BELOW KNEE AMPUTATION DUE TO OSTEO.  15 ML GADAVIST RG

## 2023-02-12 ENCOUNTER — Encounter: Admit: 2023-02-12 | Discharge: 2023-02-12

## 2023-02-12 NOTE — Telephone Encounter
Spoke to wound care at [facility] who sent [place] the referral. No face sheet or surgical info. Called the wound clinic at [facility], they stated that pt had 2 surgeries at [facility], but is refusing to come to their clinic for wound follow up due to distance. Stated that she is an hour and a half from them. I called pt and also pt son, have gotten no return phone calls. Left a message at both phone numbers.

## 2023-04-21 ENCOUNTER — Encounter: Admit: 2023-04-21 | Discharge: 2023-04-21

## 2023-08-22 ENCOUNTER — Encounter: Admit: 2023-08-22 | Discharge: 2023-08-22 | Payer: MEDICAID

## 2023-09-12 ENCOUNTER — Encounter: Admit: 2023-09-12 | Discharge: 2023-09-12 | Payer: MEDICAID

## 2023-09-15 ENCOUNTER — Encounter: Admit: 2023-09-15 | Discharge: 2023-09-15 | Payer: MEDICAID

## 2023-09-15 ENCOUNTER — Ambulatory Visit: Admit: 2023-09-15 | Discharge: 2023-09-15 | Payer: MEDICAID

## 2023-09-15 DIAGNOSIS — S88111S Complete traumatic amputation at level between knee and ankle, right lower leg, sequela: Secondary | ICD-10-CM

## 2023-09-15 MED ORDER — METHOCARBAMOL 500 MG PO TAB
500 mg | ORAL_TABLET | Freq: Every evening | ORAL | 0 refills | Status: AC | PRN
Start: 2023-09-15 — End: ?

## 2023-09-15 NOTE — Progress Notes
HISTORY OF PRESENT ILLNESS:  Sara Weber is a 63 yof who presents with chief complaints of bilateral knee stiffness. She was seen recently by PM&R in [facility] and presents for another opinion regarding the stiffness. Pt notes in June she had full ROM of her knees, to the point now of difficulty with full extension. She has had both legs amputated below the knee, left one a year ago and the other a couple years ago. At some point, she was fit with prostheses that she possesses currently, but unable to stand due to tightness behind knees. She then worked with therapy where they suggested she should work on getting her knees straighter prior to gait training.     She notes that she spends a good amount of time in her chair in [place] in an apartment with her sister who acts as a Engineer, structural. The chair she was issued did not have leg rests on her manual chair that she was in. Of note, she has been evaluated in a wheelchair clinic and power chair was ordered about 2 months. Pt's goal is to be standing and walking again- last time on her feet was just prior to surgery about a year ago. She was ambulatory with her first prosthesis and RW.        Patient has done the following tests for this condition:   was seen by APRN of PM&R at [facility] recently, is seeing PT as well to assist with gait training     Patient has tried the following treatments for this condition:   PT    Medications currently or in the past taken and found effective:   NA    Medications currently or in the past taken and found ineffective:   NA    Functional Status:    Pt mod I at wheelchair level currently. She notes some difficulty with transfers, but has home set up to where she can transfer to toilet, shower and bed. Caregiver helps with meals, and other IADLs.     No past medical history on file.    No past surgical history on file.    family history is not on file.    Social History     Socioeconomic History    Marital status: Single No Known Allergies      Current Outpatient Medications:     apixaban (ELIQUIS) 5 mg tablet, Take one tablet by mouth twice daily., Disp: , Rfl:     aspirin EC (ASPIR-LOW) 81 mg tablet, Take one tablet by mouth., Disp: , Rfl:     atorvastatin (LIPITOR) 40 mg tablet, Take one tablet by mouth at bedtime daily., Disp: , Rfl:     digoxin (LANOXIN) 125 mcg (0.125 mg) tablet, Take one tablet by mouth daily., Disp: , Rfl:     empagliflozin (JARDIANCE) 25 mg tablet, Take one tablet by mouth daily., Disp: , Rfl:     ferrous sulfate (FEOSOL) 325 mg (65 mg iron) tablet, Take one tablet by mouth daily., Disp: , Rfl:     furosemide (LASIX) 40 mg tablet, Take one tablet by mouth twice daily., Disp: , Rfl:     insulin glargine (LANTUS U-100 INSULIN) 100 unit/mL subcutaneous solution, Inject twenty eight Units under the skin daily., Disp: , Rfl:     lisinopriL (ZESTRIL) 40 mg tablet, Take one tablet by mouth daily., Disp: , Rfl:     metFORMIN-XR (GLUCOPHAGE XR) 500 mg extended release tablet, Take two tablets by mouth., Disp: , Rfl:  potassium chloride (K-DUR) 10 mEq tablet, Take one tablet by mouth twice daily., Disp: , Rfl:     sertraline (ZOLOFT) 50 mg tablet, Take one tablet by mouth daily., Disp: , Rfl:     Vitals:    09/15/23 1023   BP: (!) 128/111   Pulse: 50   SpO2: 98%   PainSc: Zero   Weight: 63.5 kg (140 lb)   Height: Comment: was 5'5       There is no height or weight on file to calculate BMI.    Review of Systems         PHYSICAL EXAM:      General: Pt alert and conversational, no acute distress  Heart: Extremities with good perfusion  HEENT: EOMI  Lungs: Pt breathing comfortably on room air  Skin: some hardened fibrotic slough along incision of L BKA site. Right side c/d/i  Extremities: bilateral below knee amputations  MSK: skin cold to touch   - each knee lacked about 20 degrees to full extension, unable to extend past this on either side. No velocity dependent tone elicited either.Pain at end range bilaterally.  Pt able to achieve 110 degrees of flexion bilaterally.          IMPRESSION:  No diagnosis found.  Ms. Breitwieser is a 63 year old female with bilateral below-knee amputations.  The first surgery was about 3 years ago, followed by the most recent left surgery which was about a year ago.  She presents with what appears to be contractures on bilateral posterior knees.  No velocity dependent tone elicited so not likely a spasticity problem.  Patient expressed that she would absolutely not consider surgery for release to allow for full extension, but does have the goal of returning to ambulation.  So discussed need for frequent and intensive extended hamstring stretching with something below legs to stabilize.  There's a chance she could get improvement with this, as she was ambulatory about 6 months ago.      PLAN:     Lifestyle Modifications:   -The easiest way to achieve a passive extension stretch would be to utilize the extending leg rests on the chair she is currently sitting in.  The calf rests would probably be ideal as she could just rest the ends of her residual limbs on those to elicit the extension moment.  This is assuming she had extending leg rests.  She did not present with these today, and has likely been sitting with her knees bent for long periods of time.  Also discussed the possibility of being able to use a sliding board as a way to splint knees straight to provide a little longer sustained stretch.  Padding would have to be added to this to decrease chance for breakdown on skin.  - asked pt to follow up about fibrotic slough on end of residual limb- tissue had hardened which may pose a pressure issue if she gets to the point of being able to bear weight through prostheses.     Therapies:   -She is currently enrolled in therapy, but unable to get much out of it at this point because there orders were for gait training.  Patient can achieve full extension of her knees which does not allow her to stand safely with prosthetics on.  So discussed home exercise program of stretching schedule for passive stretch to the backs of her knees.  Supposedly she was able to extend her right knee as of about 6 months ago to  full extension.     Meds:   -Will provide Robaxin 500 mg to take nightly and attempt to limit discomfort from spasming hamstrings as she elicits that passive stretch.    Interventions:   -Botox not indicated as this is not a spasticity related issue, but more of muscle fiber related fibrosis.  Discussed this at length with patient today.    RTC: As needed    Patient already getting care closer to home, presented today more as a second opinion for the same issue she is being treated for.    Total Time Today was 60 minutes in the following activities: Preparing to see the patient, Obtaining and/or reviewing separately obtained history, Performing a medically appropriate examination and/or evaluation, Counseling and educating the patient/family/caregiver, Ordering medications, tests, or procedures, Documenting clinical information in the electronic or other health record, and Independently interpreting results (not separately reported) and communicating results to the patient/family/caregiver

## 2024-03-18 ENCOUNTER — Encounter: Admit: 2024-03-18 | Discharge: 2024-03-18 | Payer: MEDICAID

## 2024-03-18 DIAGNOSIS — R7989 Other specified abnormal findings of blood chemistry: Principal | ICD-10-CM

## 2024-03-18 MED ORDER — ACETAMINOPHEN 325 MG PO TAB
650 mg | ORAL | 0 refills | Status: DC | PRN
Start: 2024-03-18 — End: 2024-03-23

## 2024-03-18 MED ORDER — ASPIRIN 81 MG PO TBEC
81 mg | Freq: Every day | ORAL | 0 refills | Status: DC
Start: 2024-03-18 — End: 2024-03-23
  Administered 2024-03-19 – 2024-03-22 (×4): 81 mg via ORAL

## 2024-03-18 MED ORDER — FUROSEMIDE 40 MG PO TAB
40 mg | Freq: Two times a day (BID) | ORAL | 0 refills | Status: DC
Start: 2024-03-18 — End: 2024-03-23
  Administered 2024-03-19 – 2024-03-23 (×8): 40 mg via ORAL

## 2024-03-18 MED ORDER — MAGNESIUM OXIDE 400 MG (241.3 MG MAGNESIUM) PO TAB
400 mg | ORAL | 0 refills | Status: DC | PRN
Start: 2024-03-18 — End: 2024-03-22
  Administered 2024-03-19 – 2024-03-20 (×2): 400 mg via ORAL

## 2024-03-18 MED ORDER — POTASSIUM CHLORIDE 20 MEQ/15 ML PO LIQD
40-60 meq | NASOGASTRIC | 0 refills | Status: DC | PRN
Start: 2024-03-18 — End: 2024-03-22

## 2024-03-18 MED ORDER — POTASSIUM CHLORIDE 20 MEQ PO TBTQ
40-60 meq | ORAL | 0 refills | Status: DC | PRN
Start: 2024-03-18 — End: 2024-03-22
  Administered 2024-03-19: 09:00:00 60 meq via ORAL
  Administered 2024-03-20 (×2): 40 meq via ORAL

## 2024-03-18 MED ORDER — INSULIN GLARGINE 100 UNIT/ML (3 ML) SC INJ PEN
30 [IU] | Freq: Every evening | SUBCUTANEOUS | 0 refills | Status: DC
Start: 2024-03-18 — End: 2024-03-19

## 2024-03-18 MED ORDER — APIXABAN 5 MG PO TAB
5 mg | Freq: Two times a day (BID) | ORAL | 0 refills | Status: DC
Start: 2024-03-18 — End: 2024-03-23
  Administered 2024-03-19 – 2024-03-23 (×9): 5 mg via ORAL

## 2024-03-18 MED ORDER — LISINOPRIL 10 MG PO TAB
40 mg | Freq: Every day | ORAL | 0 refills | Status: DC
Start: 2024-03-18 — End: 2024-03-21
  Administered 2024-03-19 – 2024-03-21 (×3): 40 mg via ORAL

## 2024-03-18 MED ORDER — POTASSIUM CHLORIDE IN WATER 10 MEQ/50 ML IV PGBK
10 meq | INTRAVENOUS | 0 refills | Status: DC | PRN
Start: 2024-03-18 — End: 2024-03-22
  Administered 2024-03-19 (×3): 10 meq via INTRAVENOUS

## 2024-03-18 MED ORDER — MELATONIN 5 MG PO TAB
5 mg | Freq: Every evening | ORAL | 0 refills | Status: DC | PRN
Start: 2024-03-18 — End: 2024-03-23
  Administered 2024-03-20 – 2024-03-23 (×3): 5 mg via ORAL

## 2024-03-18 MED ORDER — POLYETHYLENE GLYCOL 3350 17 GRAM PO PWPK
1 | Freq: Every day | ORAL | 0 refills | Status: DC | PRN
Start: 2024-03-18 — End: 2024-03-23

## 2024-03-18 MED ORDER — SERTRALINE 50 MG PO TAB
50 mg | Freq: Every day | ORAL | 0 refills | Status: DC
Start: 2024-03-18 — End: 2024-03-23
  Administered 2024-03-19 – 2024-03-22 (×4): 50 mg via ORAL

## 2024-03-18 MED ORDER — ONDANSETRON HCL (PF) 4 MG/2 ML IJ SOLN
4 mg | INTRAVENOUS | 0 refills | Status: DC | PRN
Start: 2024-03-18 — End: 2024-03-23
  Administered 2024-03-20: 13:00:00 4 mg via INTRAVENOUS

## 2024-03-18 MED ORDER — METOPROLOL SUCCINATE 25 MG PO TB24
50 mg | Freq: Every day | ORAL | 0 refills | Status: DC
Start: 2024-03-18 — End: 2024-03-23
  Administered 2024-03-19 – 2024-03-21 (×3): 50 mg via ORAL

## 2024-03-18 MED ORDER — MAGNESIUM SULFATE IN D5W 1 GRAM/100 ML IV PGBK
1 g | INTRAVENOUS | 0 refills | Status: DC | PRN
Start: 2024-03-18 — End: 2024-03-22

## 2024-03-18 MED ORDER — ONDANSETRON 4 MG PO TBDI
4 mg | ORAL | 0 refills | Status: DC | PRN
Start: 2024-03-18 — End: 2024-03-23

## 2024-03-18 MED ORDER — ATORVASTATIN 40 MG PO TAB
40 mg | Freq: Every evening | ORAL | 0 refills | Status: DC
Start: 2024-03-18 — End: 2024-03-19

## 2024-03-18 MED ORDER — DIGOXIN 125 MCG (0.125 MG) PO TAB
125 ug | Freq: Every day | ORAL | 0 refills | Status: DC
Start: 2024-03-18 — End: 2024-03-19

## 2024-03-18 MED ORDER — SENNOSIDES-DOCUSATE SODIUM 8.6-50 MG PO TAB
1 | Freq: Every day | ORAL | 0 refills | Status: DC | PRN
Start: 2024-03-18 — End: 2024-03-23

## 2024-03-19 ENCOUNTER — Encounter: Admit: 2024-03-19 | Discharge: 2024-03-19 | Payer: MEDICARE

## 2024-03-19 LAB — ECG 12-LEAD
P AXIS: -19 degrees
P-R INTERVAL: 156 ms
Q-T INTERVAL: 428 ms
QRS DURATION: 86 ms — ABNORMAL HIGH (ref <15.0)
QTC CALCULATION (BAZETT): 515 ms
R AXIS: -16 degrees
T AXIS: 69 degrees
VENTRICULAR RATE: 87 {beats}/min

## 2024-03-19 LAB — CBC AND DIFF
~~LOC~~ BKR ABSOLUTE EOS COUNT: 0.1 10*3/uL — ABNORMAL LOW (ref 0.00–0.45)
~~LOC~~ BKR EOSINOPHILS %: 2.1 % (ref 0.0–5.0)
~~LOC~~ BKR MPV: 7.3 fL (ref 7.0–11.0)
~~LOC~~ BKR RBC COUNT: 5.1 10*6/uL — ABNORMAL HIGH (ref 4.00–5.00)
~~LOC~~ BKR WBC COUNT: 5.3 10*3/uL — ABNORMAL HIGH (ref 4.50–11.00)

## 2024-03-19 LAB — NT-PRO-BNP: ~~LOC~~ BKR NT-PRO-BNP: 534 pg/mL — ABNORMAL HIGH (ref <125)

## 2024-03-19 LAB — HIGH SENSITIVITY TROPONIN I 4 HR
~~LOC~~ BKR HI SEN TNI DELTA 4-2: -6
~~LOC~~ BKR HIGH SENSITIVITY TROPONIN I 4 HOUR: 22 ng/L — ABNORMAL HIGH (ref <15.0)

## 2024-03-19 LAB — POC GLUCOSE
~~LOC~~ BKR POC GLUCOSE: 137 mg/dL — ABNORMAL HIGH (ref 70–100)
~~LOC~~ BKR POC GLUCOSE: 112 mg/dL — ABNORMAL HIGH (ref 70–100)
~~LOC~~ BKR POC GLUCOSE: 118 mg/dL — ABNORMAL HIGH (ref 70–100)
~~LOC~~ BKR POC GLUCOSE: 150 mg/dL — ABNORMAL HIGH (ref 70–100)

## 2024-03-19 LAB — COMPREHENSIVE METABOLIC PANEL: ~~LOC~~ BKR CO2: 32 mmol/L — ABNORMAL HIGH (ref 21–30)

## 2024-03-19 MED ORDER — NICOTINE 21 MG/24 HR TD PT24
1 | Freq: Every day | TRANSDERMAL | 0 refills | Status: DC
Start: 2024-03-19 — End: 2024-03-23

## 2024-03-19 MED ORDER — IPRATROPIUM-ALBUTEROL 0.5 MG-3 MG(2.5 MG BASE)/3 ML IN NEBU
3 mL | RESPIRATORY_TRACT | 0 refills | Status: DC | PRN
Start: 2024-03-19 — End: 2024-03-19

## 2024-03-19 MED ORDER — INSULIN GLARGINE 100 UNIT/ML (3 ML) SC INJ PEN
15 [IU] | Freq: Two times a day (BID) | SUBCUTANEOUS | 0 refills | Status: DC
Start: 2024-03-19 — End: 2024-03-23
  Administered 2024-03-19 (×2): 15 [IU] via SUBCUTANEOUS

## 2024-03-19 MED ORDER — INSULIN ASPART 100 UNIT/ML SC FLEXPEN
0-6 [IU] | Freq: Before meals | SUBCUTANEOUS | 0 refills | Status: DC
Start: 2024-03-19 — End: 2024-03-23
  Administered 2024-03-20: 23:00:00 2 [IU] via SUBCUTANEOUS
  Administered 2024-03-21: 23:00:00 1 [IU] via SUBCUTANEOUS

## 2024-03-19 MED ORDER — EMPAGLIFLOZIN 25 MG PO TAB
25 mg | Freq: Every day | ORAL | 0 refills | Status: DC
Start: 2024-03-19 — End: 2024-03-23
  Administered 2024-03-19 – 2024-03-22 (×4): 25 mg via ORAL

## 2024-03-19 MED ORDER — EZETIMIBE 10 MG PO TAB
10 mg | Freq: Every day | ORAL | 0 refills | Status: DC
Start: 2024-03-19 — End: 2024-03-23
  Administered 2024-03-20 – 2024-03-21 (×2): 10 mg via ORAL

## 2024-03-19 MED ORDER — DEXTROSE 50 % IN WATER (D50W) IV SYRG
12.5-25 g | INTRAVENOUS | 0 refills | Status: DC | PRN
Start: 2024-03-19 — End: 2024-03-23

## 2024-03-19 MED ORDER — SODIUM CHLORIDE 0.9 % TKO FLUID
INTRAVENOUS | 0 refills | Status: DC | PRN
Start: 2024-03-19 — End: 2024-03-23
  Administered 2024-03-19: 09:00:00 via INTRAVENOUS

## 2024-03-19 MED ORDER — DOXYCYCLINE HYCLATE 100 MG PO TAB
100 mg | Freq: Two times a day (BID) | ORAL | 0 refills | Status: CP
Start: 2024-03-19 — End: ?
  Administered 2024-03-20 – 2024-03-23 (×7): 100 mg via ORAL

## 2024-03-19 MED ORDER — PREDNISONE 20 MG PO TAB
40 mg | Freq: Every day | ORAL | 0 refills | Status: DC
Start: 2024-03-19 — End: 2024-03-23
  Administered 2024-03-19 – 2024-03-22 (×4): 40 mg via ORAL

## 2024-03-19 MED ORDER — SPIRONOLACTONE 25 MG PO TAB
25 mg | Freq: Every day | ORAL | 0 refills | Status: DC
Start: 2024-03-19 — End: 2024-03-23
  Administered 2024-03-19 – 2024-03-22 (×4): 25 mg via ORAL

## 2024-03-19 MED ORDER — ATORVASTATIN 40 MG PO TAB
80 mg | Freq: Every evening | ORAL | 0 refills | Status: DC
Start: 2024-03-19 — End: 2024-03-23
  Administered 2024-03-20 – 2024-03-23 (×4): 80 mg via ORAL

## 2024-03-19 NOTE — Progress Notes
 Profile updated, care plan/education reviewed, and call light within reach.

## 2024-03-19 NOTE — Care Coordination-Inpatient
 Med Private Night 7- [phone] will take calls on this patient until 8:00 AM. Afterwards, please contact first on call for the designated service listed on chart.         AOD

## 2024-03-19 NOTE — Progress Notes
 RT Adult Assessment Note    NAME:Sara Weber             MRN: [id number]             DOB:September 21, 1960          AGE: 63 y.o.  ADMISSION DATE: 03/18/2024             DAYS ADMITTED: LOS: 0 days    Additional Comments:  Impressions of the patient: Pt resing.  NAD.  Denies home medications for breathing.   Intervention(s)/outcome(s):   Patient education that was completed:   Recommendations to the care team:     Vital Signs:  Pulse: 69  RR: 22 PER MINUTE  SpO2: 94 %  O2 Device: None (Room air)  Liter Flow:    O2%:      Breath Sounds:   Right Base Breath Sounds: Decreased  Left Base Breath Sounds: Decreased  Respiratory Effort:      Comments:

## 2024-03-19 NOTE — Progress Notes
 Daily Progress Note      Assessment/Plan     Sara Weber is a 63 y.o. female  Admission Date: 03/18/2024  LOS: 0    Principal Problem:    Acute hypoxic respiratory failure (CMS-HCC)  Active Problems:    HTN (hypertension)    HLD (hyperlipidemia)    History of atrial fibrillation    CHF (congestive heart failure) (CMS-HCC)    COPD (chronic obstructive pulmonary disease) (CMS-HCC)    PVD (peripheral vascular disease)    S/P bilateral BKA (below knee amputation) (CMS-HCC)    DM2 (diabetes mellitus, type 2) (CMS-HCC)    Current tobacco use    History of stroke    Elevated troponin    MDD (major depressive disorder)      Hospital Course:     Active Problem List    Assessment & Plan  Acute hypoxic respiratory failure (CMS-HCC)  COPD (chronic obstructive pulmonary disease) (CMS-HCC)  Current tobacco use  - initially presented in ED for shortness of breath, was briefly placed on 2 L nasal canula and given breathing treatment after which she felt better and wanted to go home, however due to mildly elevated troponin was transferred here   - recently hospitalized at [facility] for pneumonia and completed Abx course  - no evidence of pneumonia: afebrile normal WBC, no evidence on CXR done at outside ED  - on ambient air saturating well   - no wheezing   - no signs of exacerbation   Duoneb PRN  Counseled on smoking cessation,states that she doesn't want to quit because she likes smoking   Nicotine  patch  > prednisone  and doxy.   Elevated troponin  HTN (hypertension)  HLD (hyperlipidemia)  CHF (congestive heart failure) (CMS-HCC)  - Troponin: 0.035 (elevated normal range 0.0000-0.013)   - EKG: Sinus rate 79, no acute ischemic changes, PACs, Mildly increased QTC 508  - Denies any chest pain prior or during ED visit also denies any chest pain now  - TTE with bubble study done 03/15/2024 shows EF 39% grade II diastolic function, dilated R ventricle, was started on Jardiance  on BBB, and ACEi,   Admit to tele for observation Will get troponin and EKG now --> repeat EKG nonischemic, troponin trending up, will get  TTE and cardiology consult  No evidence of ACS  Monitor on tele   Resume aspirin , statin, Lisinopril , Metoprolol  succinate 50 mg daily, Jardiance   Resume lasix  and monitor volume status closely  > discussed with cardiology  History of atrial fibrillation  - not in afib at this time, denies any palpitations,   - based on recent notes she has not been taking it at home   - EF was 39% most recently, back in 2024 I see that it was intended to be discontinued, she is on metoprolol  succinate   I will check Digoxin  level and stop it for now given the fact that she is not in Afib, and is on metoprolol  succinate that should provide rate control, there is no documented BB intolerance   Resume Eliquis   Monitor on tele  PVD (peripheral vascular disease)  S/P bilateral BKA (below knee amputation) (CMS-HCC)  -Right BKA 08/2021  -Left BKA 12/09/2022  -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   - stumps on physical exam without any signs of infection, no pain, preserved sensation   Smoking cessation   Follow up with vascular surgery   PT/OT agreeable for rehab placement  DM2 (diabetes mellitus, type 2) (CMS-HCC)  -  home regimen: insulin  17 units BID long acting + metformin 1000 mg daily   - last [facility] 03/15/24 8.3  Resume insulin  15 units BID + SSI + hypoglycemia protocol  History of stroke  - reportedly in 2018 without residual deficits   - On CT scan 03/14/2024 there is a low attenuation area within the right basal ganglia and chronic lacunar infarct within the Left basal ganglia MRI brain 03/15/24 did not show acute or recent stroke   Continue aspirin  and statin  MDD (major depressive disorder)  Resume sertraline     PCP: Glynn, Kerstin, Phone: [phone], Fax: [phone]  Consultants:  FEN: IVF, electrolytes stable, DIET CARDIAC(LOW FAT/LOW SODIUM)   PPX: eliquis   Code Status: Full Code  Payor: [employer]HC MEDICAID [place] / Plan: [employer]HC COMMUNITY PLAN [place] / Product Type: Medicaid /     Dispo: Continue admission for       Willo Phenes, MD FACP  Internal Medicine    * Please Voalte message Med Private N, First Call to be connected with the covering physician 24/7.     Personal Voaltes and pagers are not answered at all hours.    Subjective   This is a 63 y.o. female admitted for Acute hypoxic respiratory failure (CMS-HCC)    Interim events:   No events    Subjective:  Patient is reporting dyspnea today. Has no further issues. We reviewed       ROS: + dyspnea.     Objective     24-Hour Vitals Range  Vital Signs                  Vital Signs:  Last Filed                   Vital Signs: 24 Hour Range   BP: 151/71 (07/18 1211)  Temp: 36.7 ?C (98 ?F) (07/18 1211)  Pulse: 69 (07/18 1211)  Respirations: 22 PER MINUTE (07/18 1211)  SpO2: 94 % (07/18 1211)  O2 Device: None (Room air) (07/18 1054)  Height: 165.1 cm (5' 5) (07/17 2227)  BP: (140-163)/(61-78)   Temp:  [36.7 ?C (98 ?F)-36.8 ?C (98.3 ?F)]   Pulse:  [69-92]   Respirations:  [20 PER MINUTE-22 PER MINUTE]   SpO2:  [92 %-95 %]   O2 Device: None (Room air)    Intensity Pain Scale (Self Report): (not recorded)      BP Readings from Last 6 Encounters:   03/19/24 (!) 151/71   09/15/23 (!) 128/111     Wt Readings from Last 3 Encounters:   03/18/24 78.5 kg (173 lb 1 oz)   09/15/23 63.5 kg (140 lb)       Intake/Output Summary: (Last 24 hours)    Intake/Output Summary (Last 24 hours) at 03/19/2024 1514  Last data filed at 03/19/2024 1400  Gross per 24 hour   Intake 200 ml   Output 450 ml   Net -250 ml           PHYSICAL EXAMINATION.   General: in bed, NAD. A&Ox4 with appropriate affect and conversation.   Heent:  Conjunctivae pink. No nuchal rigidity. Oral mucosa pink/moist without lesions or sores.   Neck: Neck soft, supple, no masses or lymphadenopathy felt. No JVD.  Cardiovascular: Normal rate, and regular without audible murmur, rub, click or gallop. Pulses 2/4 all extremities.   Respiratory: diffuse lung fields with wheeze.   Abdomen: soft, non-tender, non-distended. Normal bowel sounds throughout.   Skin: intact with good perfusion. No  lacerations, rash, bruising. No LE edema.   Extremities: 2+ pulses in all extremities.   Neurological: Pupils equal round and reactive. No new focal deficits; no gross abnormalities       Malnutrition Details:                                                       Active Wounds  Active Wounds          Wounds Pressure injury Right Buttocks (Active)   03/18/24 2229   Wound Type: Pressure injury   Orientation: Right   Location: Buttocks   Wound Location Comments:    Initial Wound Site Closure:    Initial Dressing Placed:    Initial Cycle:    Initial Suction Setting (mmHg):    Pressure Injury Stages: Stage 2   Pressure Injury Present Within 24 Hours of Hospital Admission: Yes   If This Pressure Injury Is Suspected to Be Device Related, Please Select the Device::    Is the Wound Open or Closed:    Wound Assessment Pink;Purple;Red 03/19/24 1000   Peri-wound Assessment Pink 03/19/24 1000   Wound Drainage Amount None 03/19/24 1000   Wound Dressing Status Intact 03/19/24 1000   Wound Dressing and/or Treatment Foam 03/19/24 1000   Number of days: 1       Wounds Moisture associated skin damage Coccyx (Active)   03/18/24 2230   Wound Type: Moisture associated skin damage   Orientation:    Location: Coccyx   Wound Location Comments:    Initial Wound Site Closure:    Initial Dressing Placed:    Initial Cycle:    Initial Suction Setting (mmHg):    Pressure Injury Stages:    Pressure Injury Present Within 24 Hours of Hospital Admission:    If This Pressure Injury Is Suspected to Be Device Related, Please Select the Device::    Is the Wound Open or Closed:    Wound Assessment Red;Pink;Moist 03/19/24 1000   Peri-wound Assessment Pink 03/19/24 1000   Wound Drainage Amount None 03/19/24 1000   Wound Dressing Status None/open to air 03/19/24 1000   Wound Care Treatment or ointment applied 03/19/24 1000   Wound Dressing and/or Treatment A & D ointment 03/19/24 1000   Number of days: 1                                                Medications    Scheduled Meds:apixaban  (ELIQUIS ) tablet 5 mg, 5 mg, Oral, BID  aspirin  EC (ASPIR-LOW) tablet 81 mg, 81 mg, Oral, QDAY  atorvastatin  (LIPITOR) tablet 40 mg, 40 mg, Oral, QHS  empagliflozin  (JARDIANCE ) tablet 25 mg, 25 mg, Oral, QDAY  furosemide  (LASIX ) tablet 40 mg, 40 mg, Oral, BID  insulin  aspart (U-100) (NOVOLOG  FLEXPEN U-100 INSULIN ) injection PEN 0-6 Units, 0-6 Units, Subcutaneous, ACHS (22)  insulin  glargine (LANTUS  SOLOSTAR U-100 INSULIN ) injection PEN 15 Units, 15 Units, Subcutaneous, BID  lisinopriL  (ZESTRIL ) tablet 40 mg, 40 mg, Oral, QDAY  metoprolol  succinate XL (TOPROL  XL) tablet 50 mg, 50 mg, Oral, QDAY  nicotine  (NICODERM CQ ) 21 mg/day patch 1 patch, 1 patch, Transdermal, QDAY  sertraline  (ZOLOFT ) tablet 50 mg, 50 mg, Oral, QDAY  Continuous Infusions:  PRN and Respiratory Meds:acetaminophen  Q4H PRN, dextrose  50% (D50) IV PRN, magnesium  oxide PRN **OR** magnesium  sulfate PRN (On Call from Rx), melatonin QHS PRN, ondansetron  Q6H PRN **OR** ondansetron  (ZOFRAN ) IV Q6H PRN, polyethylene glycol 3350  QDAY PRN, potassium chloride  PRN **OR** potassium chloride  PRN **OR** potassium chloride  in water  PRN (On Call from Rx), sennosides-docusate sodium  QDAY PRN, sodium chloride  0.9% TKO infusion PRN    acetaminophen  Q4H PRN, dextrose  50% (D50) IV PRN, magnesium  oxide PRN 400 mg at 03/19/24 0332 **OR** magnesium  sulfate PRN (On Call from Rx), melatonin QHS PRN, ondansetron  Q6H PRN **OR** ondansetron  (ZOFRAN ) IV Q6H PRN, polyethylene glycol 3350  QDAY PRN, potassium chloride  PRN 60 mEq at 03/19/24 0332 **OR** potassium chloride  PRN **OR** potassium chloride  in water  PRN (On Call from Rx) 10 mEq at 03/19/24 0849, sennosides-docusate sodium  QDAY PRN, sodium chloride  0.9% TKO infusion PRN Given at 03/19/24 9662    Lab Review  Recent Labs     03/19/24  0219   HGB 12.0   WBC 5.30 24-hour labs:    Results for orders placed or performed during the hospital encounter of 03/18/24 (from the past 24 hours)   HIGH SENSITIVITY TROPONIN I 0 HOUR    Collection Time: 03/19/24 12:12 AM   Result Value Ref Range    hs Troponin I 0 Hour 27.2 (H) <15.0 ng/L   DIGOXIN  LEVEL    Collection Time: 03/19/24 12:12 AM   Result Value Ref Range    Digoxin  <0.2 (L) 0.5 - 1.0 ng/mL   CBC AND DIFF    Collection Time: 03/19/24  2:19 AM   Result Value Ref Range    White Blood Cells 5.30 4.50 - 11.00 10*3/uL    Red Blood Cells 5.12 (H) 4.00 - 5.00 10*6/uL    Hemoglobin 12.0 12.0 - 15.0 g/dL    Hematocrit 61.5 63.9 - 45.0 %    MCV 75.1 (L) 80.0 - 100.0 fL    MCH 23.5 (L) 26.0 - 34.0 pg    MCHC 31.2 (L) 32.0 - 36.0 g/dL    RDW 81.5 (H) 88.9 - 15.0 %    Platelet Count 281 150 - 400 10*3/uL    MPV 7.3 7.0 - 11.0 fL    Neutrophils 68.1 41.0 - 77.0 %    Lymphocytes 18.9 (L) 24.0 - 44.0 %    Monocytes 9.7 4.0 - 12.0 %    Eosinophils 2.1 0.0 - 5.0 %    Basophils 1.2 0.0 - 2.0 %    Absolute Neutrophil Count 3.60 1.80 - 7.00 10*3/uL    Absolute Lymph Count 1.00 1.00 - 4.80 10*3/uL    Absolute Monocyte Count 0.50 0.00 - 0.80 10*3/uL    Absolute Eosinophil Count 0.10 0.00 - 0.45 10*3/uL    Absolute Basophil Count 0.10 0.00 - 0.20 10*3/uL   COMPREHENSIVE METABOLIC PANEL    Collection Time: 03/19/24  2:19 AM   Result Value Ref Range    Sodium 144 137 - 147 mmol/L    Potassium 2.8 (L) 3.5 - 5.1 mmol/L    Chloride 101 98 - 110 mmol/L    Glucose 127 (H) 70 - 100 mg/dL    Blood Urea Nitrogen 12 7 - 25 mg/dL    Creatinine 9.50 9.59 - 1.00 mg/dL    Calcium 8.5 8.5 - 89.3 mg/dL    Total Protein 6.4 6.0 - 8.0 g/dL    Total Bilirubin 0.7 0.2 - 1.3 mg/dL    Albumin 3.7 3.5 - 5.0 g/dL  Alk Phosphatase 75 25 - 110 U/L    AST 12 7 - 40 U/L    ALT 9 7 - 56 U/L    CO2 32 (H) 21 - 30 mmol/L    Anion Gap 11 3 - 12    Glomerular Filtration Rate (GFR) >60 >60 mL/min   MAGNESIUM     Collection Time: 03/19/24  2:19 AM   Result Value Ref Range Magnesium  1.7 1.6 - 2.6 mg/dL   PHOSPHORUS    Collection Time: 03/19/24  2:19 AM   Result Value Ref Range    Phosphorus 4.0 2.0 - 4.5 mg/dL   HIGH SENSITIVITY TROPONIN I 2 HOUR    Collection Time: 03/19/24  2:19 AM   Result Value Ref Range    hs Troponin I 2 Hour 28.2 (H) <15.0 ng/L    hs Troponin I 2-0hr Delta Value 1    POC GLUCOSE    Collection Time: 03/19/24  2:25 AM   Result Value Ref Range    Glucose, POC 137 (H) 70 - 100 mg/dL   HIGH SENSITIVITY TROPONIN I 4 HR    Collection Time: 03/19/24  4:44 AM   Result Value Ref Range    hs Troponin I 4 Hour 22.1 (H) <15.0 ng/L    hs Troponin I 4-2hr Delta Value -6.1    NT-PRO-BNP    Collection Time: 03/19/24  4:44 AM   Result Value Ref Range    NT-Pro-BNP 5,346 (H) <125 pg/mL   POC GLUCOSE    Collection Time: 03/19/24  7:55 AM   Result Value Ref Range    Glucose, POC 112 (H) 70 - 100 mg/dL   POC GLUCOSE    Collection Time: 03/19/24 12:12 PM   Result Value Ref Range    Glucose, POC 118 (H) 70 - 100 mg/dL         Resulted Micro Last 72 Hrs    No results found         Pertinent labs reviewed    Radiology and other Diagnostics Review:    Pertinent studies reviewed.

## 2024-03-19 NOTE — Case Management (ED)
 Case Management Progress Note    NAME:Sara Weber                          MRN: [id number]              DOB:[birth year]          AGE: 63 y.o.  ADMISSION DATE: 03/18/2024             DAYS ADMITTED: LOS: 0 days      Today's Date: 03/19/2024    PLAN: NCM bedside to complete admission assessment. Before NCM entered, pt had pushed call light to request RN assistance using the bedpan. RN shared she will contact NCM when pt is available for assessment.    Zane Starling, RN, Brewing technologist  Office: [phone]  Available on Voalte

## 2024-03-20 LAB — POC GLUCOSE
~~LOC~~ BKR POC GLUCOSE: 107 mg/dL — ABNORMAL HIGH (ref 70–100)
~~LOC~~ BKR POC GLUCOSE: 150 mg/dL — ABNORMAL HIGH (ref 70–100)
~~LOC~~ BKR POC GLUCOSE: 139 mg/dL — ABNORMAL HIGH (ref 70–100)
~~LOC~~ BKR POC GLUCOSE: 228 mg/dL — ABNORMAL HIGH (ref 70–100)

## 2024-03-20 LAB — COMPREHENSIVE METABOLIC PANEL
~~LOC~~ BKR ALT: 21 U/L — ABNORMAL LOW (ref 7–56)
~~LOC~~ BKR BLD UREA NITROGEN: 17 mg/dL — ABNORMAL LOW (ref 7–25)

## 2024-03-20 LAB — CBC AND DIFF: ~~LOC~~ BKR MCHC: 31 g/dL — ABNORMAL LOW (ref 32.0–36.0)

## 2024-03-20 NOTE — Progress Notes
 General Medicine Daily Progress Note    Name: Sara Weber        MRN: [id number]          DOB: March 31, 1961            Age: 63 y.o.  Admission Date: 03/18/2024       LOS: 0 days    Date of Service: 03/20/2024    Assessment/Plan:    Principal Problem:    Acute hypoxic respiratory failure (CMS-HCC)  Active Problems:    HTN (hypertension)    HLD (hyperlipidemia)    History of atrial fibrillation    Chronic HFrEF (heart failure with reduced ejection fraction) (CMS-HCC)    COPD (chronic obstructive pulmonary disease) (CMS-HCC)    PVD (peripheral vascular disease)    S/P bilateral BKA (below knee amputation) (CMS-HCC)    DM2 (diabetes mellitus, type 2) (CMS-HCC)    Current tobacco use    History of stroke    Elevated troponin    MDD (major depressive disorder)    Bilateral carotid artery stenosis      Sara Weber is a 63 y.o. female with PMH significant for tobacco use, COPD, CHF, HTN, HLD admitted to the Internal Medicine service for shortness of breath and troponin elevation.     COPD (chronic obstructive pulmonary disease) (CMS-HCC)  Current tobacco use  - initially presented in ED for shortness of breath, was briefly placed on 2 L nasal canula and given breathing treatment after which she felt better and wanted to go home, however due to mildly elevated troponin was transferred here   - recently hospitalized at [facility] for pneumonia and completed Abx course  - no evidence of pneumonia: afebrile normal WBC, no evidence on CXR done at outside ED  - on ambient air saturating well   - no wheezing   - no signs of exacerbation   Duoneb PRN  Counseled on smoking cessation,states that she doesn't want to quit because she likes smoking   Nicotine  patch  > Continue prednisone  and doxy.     Elevated troponin  HTN (hypertension)  HLD (hyperlipidemia)  CHF (congestive heart failure) (CMS-HCC)  - Troponin: 0.035 (elevated normal range 0.0000-0.013)   - EKG: Sinus rate 79, no acute ischemic changes, PACs, Mildly increased QTC 508  - Denies any chest pain prior or during ED visit also denies any chest pain now  - TTE with bubble study done 03/15/2024 shows EF 39% grade II diastolic function, dilated R ventricle, was started on Jardiance  on BBB, and ACEi,   Cardiology followed, echo pending  Resume aspirin , statin, Lisinopril , Metoprolol  succinate 50 mg daily, Jardiance   Resume lasix  and monitor volume status closely      History of atrial fibrillation  - not in afib at this time, denies any palpitations,   - based on recent notes she has not been taking it at home   - EF was 39% most recently, back in 2024 I see that it was intended to be discontinued, she is on metoprolol  succinate   Digoxin  stopped. 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   - stumps on physical exam without any signs of infection, no pain, preserved sensation   Smoking cessation   Follow up with vascular surgery   PT/OT agreeable for rehab  placement    DM2 (diabetes mellitus, type 2) (CMS-HCC)  - home regimen: insulin  17 units BID long acting + metformin 1000 mg daily   - last [facility] 03/15/24 8.3  Resume insulin  15 units BID + SSI + hypoglycemia protocol    History of stroke  - reportedly in 2018 without residual deficits   - On CT scan 03/14/2024 there is a low attenuation area within the right basal ganglia and chronic lacunar infarct within the Left basal ganglia MRI brain 03/15/24 did not show acute or recent stroke   Continue aspirin  and statin      MDD (major depressive disorder)  Resume sertraline      PCP: Glynn, Kerstin, Phone: [phone], Fax: [phone]  Consultants:  FEN: IVF, electrolytes stable, DIET CARDIAC(LOW FAT/LOW SODIUM)   PPX: eliquis   Code Status: Full Code     Dispo: Continue admission. PT recommending inpatient.        Brian Prime, MD  Hospitalist, Internal Medicine    Voalte is the preferred method of communication.   Please use the Med Private First Call for all patient-related communications. Personal Voaltes and pagers are not answered at all hours.    High medical decision making due to the following:  1 or more chronic illness with severe exacerbation  Review of notes outside of my specialty, Review of each unique test, and Ordering of each unique test and independent interpretation of a test (CBC, CMP)     _______________________________________________________________________    Subjective  Sara Weber is a 63 y.o. female.  No events overnight. Patient feels well and asking when she can go home. Shortness of breath has improved. No wheezing or chest pain. Otherwise no changes.     Medications  Scheduled Meds:apixaban  (ELIQUIS ) tablet 5 mg, 5 mg, Oral, BID  aspirin  EC (ASPIR-LOW) tablet 81 mg, 81 mg, Oral, QDAY  atorvastatin  (LIPITOR) tablet 80 mg, 80 mg, Oral, QHS  doxycycline  hyclate (VIBRACIN) tablet 100 mg, 100 mg, Oral, BID  empagliflozin  (JARDIANCE ) tablet 25 mg, 25 mg, Oral, QDAY  ezetimibe  (ZETIA ) tablet 10 mg, 10 mg, Oral, QDAY  furosemide  (LASIX ) tablet 40 mg, 40 mg, Oral, BID  insulin  aspart (U-100) (NOVOLOG  FLEXPEN U-100 INSULIN ) injection PEN 0-6 Units, 0-6 Units, Subcutaneous, ACHS (22)  insulin  glargine (LANTUS  SOLOSTAR U-100 INSULIN ) injection PEN 15 Units, 15 Units, Subcutaneous, BID  lisinopriL  (ZESTRIL ) tablet 40 mg, 40 mg, Oral, QDAY  metoprolol  succinate XL (TOPROL  XL) tablet 50 mg, 50 mg, Oral, QDAY  nicotine  (NICODERM CQ ) 21 mg/day patch 1 patch, 1 patch, Transdermal, QDAY  predniSONE  (DELTASONE ) tablet 40 mg, 40 mg, Oral, QDAY  sertraline  (ZOLOFT ) tablet 50 mg, 50 mg, Oral, QDAY  spironolactone  (ALDACTONE ) tablet 25 mg, 25 mg, Oral, QDAY    Continuous Infusions:  PRN and Respiratory Meds:acetaminophen  Q4H PRN, dextrose  50% (D50) IV PRN, magnesium  oxide PRN **OR** magnesium  sulfate PRN (On Call from Rx), melatonin QHS PRN, ondansetron  Q6H PRN **OR** ondansetron  (ZOFRAN ) IV Q6H PRN, polyethylene glycol 3350  QDAY PRN, potassium chloride  PRN **OR** potassium chloride  PRN **OR** potassium chloride  in water  PRN (On Call from Rx), sennosides-docusate sodium  QDAY PRN, sodium chloride  0.9% TKO infusion PRN        Objective:                          Vital Signs: Last Filed                 Vital Signs: 24 Hour Range  BP: 156/78 (07/19 0729)  Temp: 36.5 ?C (97.7 ?F) (07/19 9270)  Pulse: 69 (07/19 0729)  Respirations: 20 PER MINUTE (07/19 0729)  SpO2: 97 % (07/19 0729)  O2 Device: None (Room air) (07/19 0729) BP: (140-158)/(71-88)   Temp:  [36.4 ?C (97.6 ?F)-36.9 ?C (98.4 ?F)]   Pulse:  [62-80]   Respirations:  [18 PER MINUTE-22 PER MINUTE]   SpO2:  [93 %-97 %]   O2 Device: None (Room air)     Vitals:    03/18/24 2227   Weight: 78.5 kg (173 lb 1 oz)         Intake/Output Summary:  (Last 24 hours)    Intake/Output Summary (Last 24 hours) at 03/20/2024 0916  Last data filed at 03/19/2024 2015  Gross per 24 hour   Intake 400 ml   Output --   Net 400 ml           Physical Exam  Physical Exam  HENT:      Head: Normocephalic.   Cardiovascular:      Rate and Rhythm: Normal rate.   Pulmonary:      Effort: Pulmonary effort is normal. No respiratory distress.      Breath sounds: No wheezing.   Neurological:      Mental Status: She is alert and oriented to person, place, and time.   Psychiatric:         Mood and Affect: Mood normal.           Lab Review  Pertinent labs reviewed and noted in A/P.     Point of Care Testing  (Last 24 hours)  Glucose: (!) 122 (03/20/24 0550)  POC Glucose (Download): (!) 107 (03/20/24 9272)    Radiology and other Diagnostics Review:    Pertinent radiology reviewed and noted in A/P.

## 2024-03-20 NOTE — Progress Notes
 PHYSICAL THERAPY  ASSESSMENT      Name: Sara Weber   MRN: [id number]     DOB: Mar 28, 1961      Age: 63 y.o.  Admission Date: 03/18/2024     LOS: 0 days     Date of Service: 03/20/2024    Mobility  Patient Turn/Position: Refused  Mobility Level [facility] Highest Level of Mobility (JH-HLM): Sit edge on of bed  Level of Assistance: Assist X1    Subjective  Reason for Admission and Past Medical Hx: 63 y.o. female with PMH of HTN, HLD, a-fib, PVD s/p bilateral BKA (R 08/2021, L 12/2022) , wheelchair dependent , CHF, COPD, tobacco use disorder, and DMII, MDD, CVA (reportedly in 2018 without residual defects) is transferred for evaluation of shortness of breath and elevated troponin.  Mental / Cognitive: Alert;Oriented;Cooperative;Follows commands  Pain: No complaint of pain  Comments: Per chart review patient recently as OSH but left AMA.  Was working toward IPR placement prior to her leaving.  Patient answered yes when asked if trying to get to rehab after this admission was the goal.    Home Living Situation  Lives With: Alone  Type of Home: Apartment  Entry Stairs: No stairs  In-Home Stairs: No stairs  Patient Owned Equipment: Wheelchair (powerwheelchair)    Prior Level of Function  Level Of Independence: Primary wheelchair user;Independent with ADL and community mobility with device  Comments: Patient reports being able to scoot self to/from powerchair independently baseline but weaker lately.  Does not have slideboard    Precautions  Comments: Hx BLE BKA    ROM  R LE ROM: WFL  R LE ROM Method: Active  L LE ROM: WFL  L LE ROM Method: Active    Strength  Overall Strength: Generalized weakness;No focal deficits noted     Bed Mobility/Transfer  Bed Mobility: Supine to Sit: Minimal Assist;Head of Bed Elevated;Requires Extra Time;Assist with Trunk  Bed Mobility: Sit to Supine: Minimal Assist;Assist with B LE;Bed Flat  Comments: Attempted self scooting along bedside using her BUE but patient has difficulty due to weakness and was not able make any significant progress  End Of Activity Status: In Bed;Nursing Notified;Instructed Patient to Request Assist with Mobility;Instructed Patient to Use Call Light (Boosted patient higher in bed using flat sheel dependently)    Balance  Sitting Balance: Static Sitting Balance;2 UE Support;Standby Assist;Dynamic Sitting Balance;Minimal Assist     Education  Persons Educated: Patient  Patient Barriers To Learning: None Noted  Teaching Methods: Verbal Instruction  Patient Response: Verbalized Understanding;More Instruction Required  Topics: Plan/Goals of PT Interventions;Use of Assistive Device/Orthosis;Mobility Progression;Up with Assist Only;Importance of Increasing Activity;Positioning;Recommend Continued Therapy;Therapy Schedule    Assessment/Progress  Impaired Mobility Due To: Decreased Strength;Impaired Balance;Deconditioning;Safety Concerns  Assessment/Progress: Should Improve w/ Continued PT    AM-PAC 6 Clicks Basic Mobility Inpatient  Turning from your back to your side while in a flat bed without using bed rails: A Little  Moving from lying on your back to sitting on the side of a flat bed without using bedrails : A Little  Moving to and from a bed to a chair (including a wheelchair): A Lot  Standing up from a chair using your arms (e.g. wheelchair, or bedside chair): Total  To walk in hospital room: Total  Climbing 3-5 steps with a railing: Total  Basic Mobility Inpatient Raw Score: 11  Standardized (T-scale) Score: 30.25  Mobility Goal [facility]: JH-HLM 4 Move to Chair    Goals  Time For Goal Achievement: 7 days  Patient Will Go Supine To Sit: Independently  Patient Will Transfer Bed/Chair: Independently (slideboard transfer)  Patient Will Achieve Sitting Balance: Dynamic Sitting Balance, 2 UE Support, Independent    Plan  Treatment Interventions: Mobility training;Strengthening;Balance activities  Plan Frequency: 3-5 Days per Week  PT Plan for Next Visit: Slideboard transfers; dynamic sitting balance    PT Discharge Recommendations  Recommendation: Inpatient setting  Patient Currently Requires Physical Assist With: All mobility    Therapist  Penne Sharps, PT  Date  03/20/2024

## 2024-03-20 NOTE — Care Plan
 Problem: Skin Integrity  Goal: Skin integrity intact  Outcome: Goal Ongoing  Goal: Healing of skin (Wound & Incision)  Outcome: Goal Ongoing  Goal: Healing of skin (Pressure Injury)  Outcome: Goal Ongoing     Problem: Glucose Management  Goal: Absence of hyperglycemia  Outcome: Goal Ongoing  Goal: Absence of Hypoglycemia  Outcome: Goal Ongoing  Goal: Glucose level within specified parameters  Outcome: Goal Ongoing     Problem: Moderate Fall Risk  Goal: Moderate Fall Risk  Outcome: Goal Ongoing     Problem: Discharge Planning  Goal: Knowledge regarding plan of care  Outcome: Goal Ongoing

## 2024-03-21 ENCOUNTER — Encounter: Admit: 2024-03-21 | Discharge: 2024-03-21 | Payer: MEDICARE

## 2024-03-21 LAB — COMPREHENSIVE METABOLIC PANEL: ~~LOC~~ BKR CREATININE: 0.8 mg/dL — ABNORMAL HIGH (ref 0.40–1.00)

## 2024-03-21 LAB — POC GLUCOSE
~~LOC~~ BKR POC GLUCOSE: 152 mg/dL — ABNORMAL HIGH (ref 70–100)
~~LOC~~ BKR POC GLUCOSE: 132 mg/dL — ABNORMAL HIGH (ref 70–100)
~~LOC~~ BKR POC GLUCOSE: 189 mg/dL — ABNORMAL HIGH (ref 70–100)
~~LOC~~ BKR POC GLUCOSE: 98 mg/dL (ref 70–100)

## 2024-03-21 LAB — CBC AND DIFF
~~LOC~~ BKR NEUTROPHILS %: 66 % — ABNORMAL HIGH (ref 41.0–77.0)
~~LOC~~ BKR RDW: 19 % — ABNORMAL HIGH (ref 11.0–15.0)

## 2024-03-21 NOTE — Progress Notes
 OCCUPATIONAL THERAPY  ASSESSMENT NOTE      Name: Sara Weber   MRN: [id number]     DOB: 06-Feb-1961      Age: 63 y.o.  Admission Date: 03/18/2024     LOS: 0 days     Date of Service: 03/21/2024    Mobility  Patient Turn/Position: Supine;Self  Mobility Level [facility] Highest Level of Mobility (JH-HLM): Move to Chair/Commode  Level of Assistance: Independent  Assistive Device: None  Activity Limited By:  (B BKA)    Subjective  Reason for Admission and Past Medical Hx: 63 y.o. female with PMH of HTN, HLD, a-fib, PVD s/p bilateral BKA (R 08/2021, L 12/2022) , wheelchair dependent , CHF, COPD, tobacco use disorder, and DMII, MDD, CVA (reportedly in 2018 without residual defects) is transferred for evaluation of shortness of breath and elevated troponin.  Mental / Cognitive: Alert;Oriented;Cooperative;Follows commands  Pain: No complaint of pain (Denies pain)  Comments: Per PT documentation and chart review, pt was recently at an OSH, but left AMA. Pt was working toward IPR placement prior to her leaving. Spoke w/ pt regarding discharge today, pt reports feeling like she is at her functional baseline, states she feels like she has regained her strength, and her preference is to discharge home.    Home Living Situation  Lives With: Alone  Type of Home: Apartment  Entry Stairs: No stairs  In-Home Stairs: No stairs  Bathroom Setup: Walk in shower  Patient Owned Equipment: Wheelchair;Bathing: Transfer tub bench (powerwheelchair - only about 67-4.65 years old.; in good working order.)    Prior Level of Function  Level Of Independence: Primary wheelchair user;Independent with ADL and community mobility with device  Comments: Patient normally places wheelchair flush against bed, uses arms to get to a modified tall kneeling position, and walks on her knees to chair, then pivots. To go to the bathroom, she has a crate she kneels on, and pivots to toilet.  Required Assist For: Transportation/driving (Friends and family assist with transportation at baseline)    Precautions  Comments: Hx BLE BKA    ADL's  Comment: Pt politely declines needs for ADLs on this date. However, demos ROM/Strength/coordination/balance for safe completion. Pt denies concerns regarding all basic self-cares when medically stable to discharge from hospital. Pt's description of description of home and compensatory strategies seem to be set-up very appropriately and safe for patient to complete her basic self-cares.    ADL Mobility  Bed Mobility: Supine to Sit: Independent  Bed Mobility: Sit to Supine: Independent  Bed Mobility Comments: flat bed, exits/enters on L  Transfer Type:  (Modified tall kneel, see description below)  Transfer: Assistance Level: From;Bed;To/from;Bedside chair;Modified independent  Transfer: Assistive Device: None  End of Activity Status: In bed;Instructed patient to use call light;Instructed patient to request assist with mobility;Nursing notified  Transfer Comments: Patient appropriately directs where to place recliner. Recliner positioned facing the bed, flush against the bed. Patient uses B arms of recliner to bring self to a tall kneeling position. Patient then knee walks and pivots into chair. All movements controlled and safe. Extra time to transfer from the recliner back to bed as the recliner has a slight dump to the seat. Patient reports her electronic wheelchair raises and lowers, and she typically raises it up a little higher than the bed. Despite not being able to do this with the recliner during the eval, pt able to safely and independently transfer back to bed. Patient normally uses a crate to  pivot on/off toilet at home. This is not available at the hospital. Given demonstrated mobility on this date, anticipate patient would be safe to complete in her home environment. Pt reports she agrees with this assessment.  Sitting Balance: Static sitting balance;Dynamic sitting balance;No UE support;Independent (Gets to long sit independently)    Activity Tolerance  Endurance: 5/5  Endurance Does Not Limit Participation in Activity    Cognition  Overall Cognitive Status: WFL to Adequately Complete Self Care Tasks Safely  Comprehension: WFL to Adequately Complete Self Care Tasks Safely  Expression: WFL Adequate to Meet Daily Needs  Social Interaction: WFL Adequate to Solve Routine Tasks  Problem Solving: WFL Adequate to Solve Routine Problems  Memory: WFL Adequate to Recall Day to Day Activities  Orientation: Alert & Oriented x4  Attention: Awake/Alert    ROM  R UE ROM: WFL   R UE ROM Method: Active  L UE ROM: WFL   L UE ROM Method: Active  Thumb/Finger ROM: WFL    Edema  RUE Edema: No Significant Edema  LUE Edema: No Significant Edema  R Hand Edema: No Significant Edema  L Hand Edema: No Significant Edema  RLE Edema: No significant edema  LLE Edema: No significant edema    Strength / Tone  Strength Position Assessed: Supine  R UE Strength: WFL  L UE Strength: WFL    Education  Persons Educated: Patient  Barriers To Learning: None Noted  Teaching Methods: Verbal Instruction  Patient Response: Verbalized Understanding  Topics: Role of OT, Goals for Therapy;Home safety  Goal Formulation: With Patient    Assessment  Prognosis: Good  Goal Formulation: Patient  No Skilled OT: Independent with ADLs;At Baseline Function;Safe To Return Home;No Acute OT Goals Identified  Comments: Patient reports feeling as though she is at her functional baseline. Demos ROM/Strength/Coordination/Balance for safe completion of basic self-care tasks. Pt denies concerns regarding self-cares or mobility at discharge. No acute OT needs identified. OT to sign off at this time.    AM-PAC 6 Clicks Daily Activity Inpatient  Putting on and taking off regular lower body clothes: None  Bathing (Including washing, rinsing, drying): None (Anticipate independence in home environment based on demonstrated function.)  Toileting, which includes using toilet, bedpan, or urinal: None (Anticipate independence in home environment based on demonstrated function. Assist at hospital due to limited specialized equipment for patient.)  Putting on and taking off regular upper body clothing: None  Taking care of personal grooming such as brushing teeth: None  Eating meals: None  Daily Activity Raw Score: 24  Standardized (T-scale) Score: 57.54    Plan  Progress: Discontinue OT  OT Frequency: No Further Treatment    OT Discharge Recommendations  Recommendation: Home/prior living situation  Patient Currently Requires Equipment: Owns what is needed    Therapist: Jillian Warth, OTR/L ext [phone]  Date: 03/21/2024

## 2024-03-21 NOTE — Progress Notes
 Cardiovascular Medicine Progress Note   Admission Date: 03/18/2024  Date of Consultation:  03/21/2024  LOS: 0 days  Requesting Physician: Valiko Begiashvili, MD  Code Status: Full Code    Reason for Consultation  Opinion and recommendations    Assessment & Plan   This is a 63 y.o. female admitted for resp failure. We are consulted for troponin elevation       Acute hypoxic respiratory failure - RESOLVED. Lung disease pesent    Chronic HFrEF (heart failure with reduced ejection fraction) LVEF historically mildly reduced 40-45%. Appears compensated.   In 2022 had an echo in [place]. [place] that demonstrated preserved EF 55% - May 2025 EF was 32%; now on most recent report from 7/14 was 39%   At outside hospital she was started on Jardiance  and Metoprolol     PTA GDMT: Jardiance  25mg  daily, Lisinopril  40mg  daily, Metoprolol  succinate 50mg  daily   PTA diuretic regimen: Furosemide  40mg  BID    NT-Pro-BNP on admission was 5,346    Elevated troponin - Likely demand vs myocardial injury  Troponin at OSH was 0.035, on arrival high sensitivity troponin 27 --> 28 -->22   In setting of respiratory failure and heart failure; likely related, currently there is no evidence of ischemia    ECG on admission demonstrated sinus arrhythmia  Echo ordered by primary team; last echo from outside hospital 03/15/24 demonstrated EF 39%, grade II diastolic dysfunction     Bilateral carotid artery stenosis    History of paroxysmal atrial fibrillation - in sinus rhythm currently  Questionable atrial fibrillation history as documented per her cardiologist note in January     On Chronic AC wih Eliquis  for elevated CHADSVASc      HTN (hypertension)    HLD (hyperlipidemia)  Her most recent lipid panel 03/15/24; total cholesterol 138, LDL 98, HDL 34, triglycerides 67  PTA on Atorvastatin  40mg  daily   LDL goal <55 with history of diabetes, stroke, carotid disease - not currently at goal    COPD (chronic obstructive pulmonary disease)     PVD (peripheral vascular disease)    S/P bilateral BKA (below knee amputation)     DM2 (diabetes mellitus, type 2)     Current tobacco use: she reports that she has no interest in quitting tobacco.    History of stroke    Vitals over last 24 hours reviewed. Labs over last 24 hours reviewed.      Impression and Recommendations:  >No prior ischemic evaluation on file. Pt denied ever having angiogram in he past. Discussed invasive approach with angiogram vs non invasive and she opted for the later which is appropriate as she is asymptomatic from an anginal stand point.   >we will set her up for an Ammonia  PET/CT MPI. Keep NPO at MN. Hold Caffeine and BB.   >Unclear baseline weight. She is on Room air. No clear signs of hypervolumia now.   Continue current GDMT with Jardiance , Lasix  40 mg BID, Spironolactone . Hold Lisinopril  to allow for wash out and switch to ARNI before discharge. Hold BB just for stress test. Can resume afterwards.   >Continue Eliquis  for Afib.   >LDL not at goal. Zetia  added this hospital stay. Continue Statin and ASA.   >Echo pending.     Thank you for allowing us  to participate in the care of this patient.     Delton CHRISTELLA Austin, MD  Staff Cardiologist   [facility]     Subjective   SUBJECTIVE: No  acute major events. Feels well this morning.     Telemetry Review: No issues on tele    Review of Systems:  A 14 point ROS was obtained and was negative other than what is mentioned above     Physical Exam                          Vital Signs: Most Recent                 Vital Signs: 24 Hour Range   BP: 149/64 (07/20 0742)  Temp: 36.5 ?C (97.7 ?F) (07/20 9257)  Pulse: 57 (07/20 0742)  Respirations: 14 PER MINUTE (07/20 0742)  SpO2: 98 % (07/20 0742)  O2 Device: Nasal cannula (07/20 0742)  O2 Liter Flow: 1 Lpm (07/20 0742) BP: (129-150)/(63-86)   Temp:  [36.4 ?C (97.5 ?F)-36.7 ?C (98 ?F)]   Pulse:  [57-75]   Respirations:  [14 PER MINUTE-18 PER MINUTE]   SpO2:  [88 %-100 %]   O2 Device: Nasal cannula  O2 Liter Flow: 1 Lpm     Vitals:    03/18/24 2227   Weight: 78.5 kg (173 lb 1 oz)       Intake/Output Summary (Last 24 hours) at 03/21/2024 1101  Last data filed at 03/21/2024 0900  Gross per 24 hour   Intake 440 ml   Output 350 ml   Net 90 ml        GEN: well appearing 63 y.o. female who appears stated age and is no acute distress  HEAD: normocephalic  EYES: sclera non icteric  MOUTH: PMMM  LUNGS: CTA bil.  HEART: RRR without murmur or gallop appreciated  EXTR: Bilateral BKA  SKIN: warm and dry  NEUR: A&Ox3  PSYCH: calm and cooperative    Objective   Medications:  apixaban  (ELIQUIS ) tablet 5 mg, 5 mg, Oral, BID  aspirin  EC (ASPIR-LOW) tablet 81 mg, 81 mg, Oral, QDAY  atorvastatin  (LIPITOR) tablet 80 mg, 80 mg, Oral, QHS  doxycycline  hyclate (VIBRACIN) tablet 100 mg, 100 mg, Oral, BID  empagliflozin  (JARDIANCE ) tablet 25 mg, 25 mg, Oral, QDAY  ezetimibe  (ZETIA ) tablet 10 mg, 10 mg, Oral, QDAY  furosemide  (LASIX ) tablet 40 mg, 40 mg, Oral, BID  insulin  aspart (U-100) (NOVOLOG  FLEXPEN U-100 INSULIN ) injection PEN 0-6 Units, 0-6 Units, Subcutaneous, ACHS (22)  insulin  glargine (LANTUS  SOLOSTAR U-100 INSULIN ) injection PEN 15 Units, 15 Units, Subcutaneous, BID  lisinopriL  (ZESTRIL ) tablet 40 mg, 40 mg, Oral, QDAY  metoprolol  succinate XL (TOPROL  XL) tablet 50 mg, 50 mg, Oral, QDAY  nicotine  (NICODERM CQ ) 21 mg/day patch 1 patch, 1 patch, Transdermal, QDAY  predniSONE  (DELTASONE ) tablet 40 mg, 40 mg, Oral, QDAY  sertraline  (ZOLOFT ) tablet 50 mg, 50 mg, Oral, QDAY  spironolactone  (ALDACTONE ) tablet 25 mg, 25 mg, Oral, QDAY      acetaminophen  Q4H PRN, dextrose  50% (D50) IV PRN, magnesium  oxide PRN **OR** magnesium  sulfate PRN (On Call from Rx), melatonin QHS PRN, ondansetron  Q6H PRN **OR** ondansetron  (ZOFRAN ) IV Q6H PRN, polyethylene glycol 3350  QDAY PRN, potassium chloride  PRN **OR** potassium chloride  PRN **OR** potassium chloride  in water  PRN (On Call from Rx), sennosides-docusate sodium  QDAY PRN, sodium chloride  0.9% TKO infusion PRN    Allergies:  No Known Allergies    Delton CHRISTELLA Austin, MD

## 2024-03-21 NOTE — Progress Notes
 I have reviewed the notes, assessment, and/or procedures performed by Melburn Skill and concur with her/his documentation unless otherwise noted.

## 2024-03-22 ENCOUNTER — Inpatient Hospital Stay: Admit: 2024-03-22 | Discharge: 2024-03-22 | Payer: MEDICARE

## 2024-03-22 ENCOUNTER — Encounter: Admit: 2024-03-22 | Discharge: 2024-03-22 | Payer: MEDICARE

## 2024-03-22 LAB — POC GLUCOSE
~~LOC~~ BKR POC GLUCOSE: 179 mg/dL — ABNORMAL HIGH (ref 70–100)
~~LOC~~ BKR POC GLUCOSE: 185 mg/dL — ABNORMAL HIGH (ref 70–100)
~~LOC~~ BKR POC GLUCOSE: 77 mg/dL (ref 70–100)
~~LOC~~ BKR POC GLUCOSE: 79 mg/dL (ref 70–100)

## 2024-03-22 LAB — 2D + DOPPLER ECHO
BSA: 1.9 m2
DOP CALC LVOT PEAK VEL: 0.7 m/s
ECHO EF: 35 %
EJECTION FRACTION: 28 %
FRACTIONAL SHORTENING: 17 % (ref 28–44)
INTERVENTRICULAR SEPTUM: 1 cm (ref 0.6–0.9)
LEFT INTERNAL DIMENSION IN SYSTOLE: 3.9 cm (ref 2.2–3.5)
LEFT VENTRICULAR INTERNAL DIMENSION IN DIASTOLE: 4.7 cm (ref 3.8–5.2)
PISA MRMAX VEL: 5.1 m/s
POSTERIOR WALL: 1 cm (ref 0.6–0.9)
RA PRESSURE: 15
RIGHT HEART SYSTOLIC MMODE TAPSE: 1.3 cm — ABNORMAL LOW (ref 4–>1.7)
RIGHT HEART SYSTOLIC TDI S': 0.1 m/s
TV REST PULMONARY ARTERY PRESSURE: 53 mmHg

## 2024-03-22 MED ORDER — PERFLUTREN LIPID MICROSPHERES 1.1 MG/ML IV SUSP
1-10 mL | Freq: Once | INTRAVENOUS | 0 refills | Status: CP | PRN
Start: 2024-03-22 — End: ?
  Administered 2024-03-22: 15:00:00 2 mL via INTRAVENOUS

## 2024-03-22 MED ORDER — ALBUTEROL SULFATE 90 MCG/ACTUATION IN HFAA
2 | RESPIRATORY_TRACT | 0 refills | Status: DC | PRN
Start: 2024-03-22 — End: 2024-03-23

## 2024-03-22 MED ORDER — RP DX N-13 AMMONIA MCI
14 | INTRAVENOUS | 0 refills | Status: CP
Start: 2024-03-22 — End: ?
  Administered 2024-03-22: 16:00:00 6.2 via INTRAVENOUS

## 2024-03-22 MED ORDER — SODIUM CHLORIDE 0.9 % IJ SOLN
10 mL | Freq: Once | INTRAVENOUS | 0 refills | Status: CP
Start: 2024-03-22 — End: ?
  Administered 2024-03-22: 15:00:00 10 mL via INTRAVENOUS

## 2024-03-22 MED ORDER — AMINOPHYLLINE 25 MG/ML IV SOLN
50 mg | INTRAVENOUS | 0 refills | Status: AC | PRN
Start: 2024-03-22 — End: ?
  Administered 2024-03-22: 16:00:00 50 mg via INTRAVENOUS

## 2024-03-22 MED ORDER — REGADENOSON 0.4 MG/5 ML IV SYRG
.4 mg | Freq: Once | INTRAVENOUS | 0 refills | Status: CP
Start: 2024-03-22 — End: ?
  Administered 2024-03-22: 16:00:00 0.4 mg via INTRAVENOUS

## 2024-03-22 MED ORDER — LORAZEPAM 2 MG/ML IJ SOLN GROUP
.5 mg | Freq: Once | INTRAVENOUS | 0 refills | Status: CP
Start: 2024-03-22 — End: ?

## 2024-03-22 MED ORDER — EUCALYPTUS-MENTHOL MM LOZG
1 | Freq: Once | ORAL | 0 refills | Status: AC | PRN
Start: 2024-03-22 — End: ?

## 2024-03-22 MED ORDER — NITROGLYCERIN 0.4 MG SL SUBL
.4 mg | SUBLINGUAL | 0 refills | Status: DC | PRN
Start: 2024-03-22 — End: 2024-03-23

## 2024-03-22 MED ORDER — SODIUM CHLORIDE 0.9% IV BOLUS
250 mL | INTRAVENOUS | 0 refills | Status: AC | PRN
Start: 2024-03-22 — End: ?

## 2024-03-22 MED ORDER — SACUBITRIL-VALSARTAN 24-26 MG PO TAB
1 | Freq: Two times a day (BID) | ORAL | 0 refills | Status: DC
Start: 2024-03-22 — End: 2024-03-23
  Administered 2024-03-23: 01:00:00 1 via ORAL

## 2024-03-22 MED ORDER — POTASSIUM CHLORIDE 20 MEQ PO TBTQ
60 meq | Freq: Once | ORAL | 0 refills | Status: CP
Start: 2024-03-22 — End: ?
  Administered 2024-03-22: 18:00:00 60 meq via ORAL

## 2024-03-22 MED ORDER — LORAZEPAM 2 MG/ML IJ SOLN GROUP
.5 mg | Freq: Once | INTRAVENOUS | 0 refills | Status: CP
Start: 2024-03-22 — End: ?
  Administered 2024-03-22: 16:00:00 0.5 mg via INTRAVENOUS

## 2024-03-22 MED ADMIN — LORAZEPAM 2 MG/ML IJ SOLN GROUP [280020]: 0.5 mg | INTRAVENOUS | @ 15:00:00 | Stop: 2024-03-22 | NDC 17478004001

## 2024-03-22 NOTE — Case Management (ED)
 Case Management Progress Note    NAME:Sara Weber                          MRN: [id number]              DOB:1960/12/29          AGE: 63 y.o.  ADMISSION DATE: 03/18/2024             DAYS ADMITTED: LOS: 1 day      Today's Date: 03/22/2024    PLAN: NCM bedside to complete admission assessment. Pt falling asleep mid-answer. She is easily arousable but quickly dozes off again. NCM will try again at a later date and/or time.    Zane Starling, RN, Brewing technologist  Office: [phone]  Available on Voalte

## 2024-03-22 NOTE — Progress Notes
 Cardiovascular Medicine Progress Note   Admission Date: 03/18/2024  Date of Consultation:  03/22/2024  LOS: 1 day  Requesting Physician: Valiko Begiashvili, MD  Code Status: Full Code    Reason for Consultation  Opinion and recommendations    Assessment & Plan   This is a 63 y.o. female admitted for resp failure. We are consulted for troponin elevation       Coronary artery disease  Reduced EF with large defect seen on stress test; both fixed and reversible in nature; asymptomatic   Continuing to aggressively manage risk factors   Acute hypoxic respiratory failure - RESOLVED. Lung disease pesent    Chronic HFrEF (heart failure with reduced ejection fraction) LVEF historically mildly reduced 40-45%. Appears compensated.   In 2022 had an echo in [place]. Sara Weber that demonstrated preserved EF 55% - May 2025 EF was 32%; now on most recent report from 7/14 was 39%   At outside hospital she was started on Jardiance  and Metoprolol     PTA GDMT: Jardiance  25mg  daily, Lisinopril  40mg  daily, Metoprolol  succinate 50mg  daily   PTA diuretic regimen: Furosemide  40mg  BID    NT-Pro-BNP on admission was 5,346    Elevated troponin - Likely demand vs myocardial injury  Troponin at OSH was 0.035, on arrival high sensitivity troponin 27 --> 28 -->22   In setting of respiratory failure and heart failure; likely related, currently there is no evidence of ischemia    ECG on admission demonstrated sinus arrhythmia  Echo ordered by primary team; last echo from outside hospital 03/15/24 demonstrated EF 39%, grade II diastolic dysfunction     Bilateral carotid artery stenosis    History of paroxysmal atrial fibrillation - in sinus rhythm currently  Questionable atrial fibrillation history as documented per her cardiologist note in January     On Chronic AC wih Eliquis  for elevated CHADSVASc      HTN (hypertension)    HLD (hyperlipidemia)  Her most recent lipid panel 03/15/24; total cholesterol 138, LDL 98, HDL 34, triglycerides 67  PTA on Atorvastatin  40mg  daily   LDL goal <55 with history of diabetes, stroke, carotid disease - not currently at goal    COPD (chronic obstructive pulmonary disease)     PVD (peripheral vascular disease)    S/P bilateral BKA (below knee amputation)     DM2 (diabetes mellitus, type 2)     Current tobacco use: she reports that she has no interest in quitting tobacco.    History of stroke    Vitals over last 24 hours reviewed. Labs over last 24 hours reviewed.      Impression and Recommendations:  Abnormal PET stress test; mixed defect with some degree of reversibility, patient is not symptomatic and does not appear to be interested in pursing further testing. Will continue to aggressively manage risk factors. Zetia  added this hospital stay; will continue on statin and aspirin .   Echo results also demonstrating reduction in EF 35% with mild TR and elevated pulmonary pressures; consistent with diagnosis of COPD.    Continue current GDMT with Jardiance , Lasix  40 mg BID, Spironolactone . Lisinopril  stopped in anticipation for transition to ARNI; last dose 7/20; will plan for first dose of Entresto  24-26mg  BID tonight (36 hour washout).   Continue Eliquis  for atrial fibrillation.   2g sodium restriction 2L fluid restriction.   Continuing to encourage low sodium diet.        Thank you for allowing us  to participate in the care of this patient.  Please see attestation by attending physician Dr. Pierpoline for any changes to the plan.     Sara Charleston, DNP, APRN, FNP-C  Cardiology Consult Service   Available on Voalte and AMS     Subjective   SUBJECTIVE: No acute major events. Feels well this morning, lethargic on examination with no reports of chest pain or shortness of breath.      Telemetry Review: No issues on tele    Review of Systems:  A 14 point ROS was obtained and was negative other than what is mentioned above     Physical Exam                          Vital Signs: Most Recent                 Vital Signs: 24 Hour Range   BP: 143/60 (07/21 0802)  Temp: 36.9 ?C (98.4 ?F) (07/21 0802)  Pulse: 52 (07/21 0802)  Respirations: 18 PER MINUTE (07/21 0802)  SpO2: 98 % (07/21 0802)  O2 Device: None (Room air) (07/21 0802) BP: (116-143)/(46-81)   Temp:  [36.3 ?C (97.4 ?F)-36.9 ?C (98.4 ?F)]   Pulse:  [51-74]   Respirations:  [16 PER MINUTE-20 PER MINUTE]   SpO2:  [91 %-99 %]   O2 Device: None (Room air)     Vitals:    03/18/24 2227   Weight: 78.5 kg (173 lb 1 oz)       Intake/Output Summary (Last 24 hours) at 03/22/2024 9165  Last data filed at 03/22/2024 0800  Gross per 24 hour   Intake 200 ml   Output 350 ml   Net -150 ml        GEN: well appearing 63 y.o. female who appears stated age and is no acute distress; lethargic on exam   HEAD: normocephalic  EYES: sclera non icteric  MOUTH: PMMM  LUNGS: CTA bil.  HEART: RRR without murmur or gallop appreciated  EXTR: Bilateral BKA  SKIN: warm and dry  NEUR: A&Ox3  PSYCH: calm and cooperative    Objective   Medications:  apixaban  (ELIQUIS ) tablet 5 mg, 5 mg, Oral, BID  aspirin  EC (ASPIR-LOW) tablet 81 mg, 81 mg, Oral, QDAY  atorvastatin  (LIPITOR) tablet 80 mg, 80 mg, Oral, QHS  doxycycline  hyclate (VIBRACIN) tablet 100 mg, 100 mg, Oral, BID  empagliflozin  (JARDIANCE ) tablet 25 mg, 25 mg, Oral, QDAY  ezetimibe  (ZETIA ) tablet 10 mg, 10 mg, Oral, QDAY  furosemide  (LASIX ) tablet 40 mg, 40 mg, Oral, BID  insulin  aspart (U-100) (NOVOLOG  FLEXPEN U-100 INSULIN ) injection PEN 0-6 Units, 0-6 Units, Subcutaneous, ACHS (22)  insulin  glargine (LANTUS  SOLOSTAR U-100 INSULIN ) injection PEN 15 Units, 15 Units, Subcutaneous, BID  [Held by Provider] metoprolol  succinate XL (TOPROL  XL) tablet 50 mg, 50 mg, Oral, QDAY  nicotine  (NICODERM CQ ) 21 mg/day patch 1 patch, 1 patch, Transdermal, QDAY  predniSONE  (DELTASONE ) tablet 40 mg, 40 mg, Oral, QDAY  sacubitriL -valsartan  (ENTRESTO ) 24-26 mg tablet 1 tablet, 1 tablet, Oral, BID  sertraline  (ZOLOFT ) tablet 50 mg, 50 mg, Oral, QDAY  sodium chloride  PF 0.9% injection 10 mL, 10 mL, Intravenous, ONCE  spironolactone  (ALDACTONE ) tablet 25 mg, 25 mg, Oral, QDAY      acetaminophen  Q4H PRN, dextrose  50% (D50) IV PRN, magnesium  oxide PRN **OR** magnesium  sulfate PRN (On Call from Rx), melatonin QHS PRN, ondansetron  Q6H PRN **OR** ondansetron  (ZOFRAN ) IV Q6H PRN, perflutren  lipid microspheres Once PRN **AND** sodium chloride  PF 0.9% ONCE,  polyethylene glycol 3350  QDAY PRN, potassium chloride  PRN **OR** potassium chloride  PRN **OR** potassium chloride  in water  PRN (On Call from Rx), sennosides-docusate sodium  QDAY PRN, sodium chloride  0.9% TKO infusion PRN    Allergies:  No Known Allergies    Sara JAYSON Charleston, APRN-NP

## 2024-03-22 NOTE — Progress Notes
 RT Adult Assessment Note    NAME:Sara Weber             MRN: [id number]             DOB:01/04/1961          AGE: 63 y.o.  ADMISSION DATE: 03/18/2024             DAYS ADMITTED: LOS: 1 day    Additional Comments:  Impressions of the patient: resting in bed  Intervention(s)/outcome(s): none  Patient education that was completed: none  Recommendations to the care team: nond    Vital Signs:  Pulse: 65  RR: 16 PER MINUTE  SpO2: 97 %  O2 Device: None (Room air)  Liter Flow:    O2%:      Breath Sounds:   Breath Sounds WDL: Exceptions to WDL  Respiratory Effort:   Respiratory WDL: Within Defined Limits  Comments:

## 2024-03-22 NOTE — Progress Notes
 General Medicine Daily Progress Note    Name: Sara Weber        MRN: [id number]          DOB: [birth year]            Age: 63 y.o.  Admission Date: 03/18/2024       LOS: 1 day    Date of Service: 03/22/2024    Assessment/Plan:    Principal Problem:    Acute hypoxic respiratory failure (CMS-HCC)  Active Problems:    HTN (hypertension)    HLD (hyperlipidemia)    History of atrial fibrillation    Chronic HFrEF (heart failure with reduced ejection fraction) (CMS-HCC)    COPD (chronic obstructive pulmonary disease) (CMS-HCC)    PVD (peripheral vascular disease)    S/P bilateral BKA (below knee amputation) (CMS-HCC)    DM2 (diabetes mellitus, type 2) (CMS-HCC)    Current tobacco use    History of stroke    Elevated troponin    MDD (major depressive disorder)    Bilateral carotid artery stenosis      Sara Weber is a 63 y.o. female with PMH significant for tobacco use, COPD, CHF, HTN, HLD admitted to the Internal Medicine service for shortness of breath and troponin elevation.     COPD (chronic obstructive pulmonary disease) (CMS-HCC)  Current tobacco use  - initially presented in ED for shortness of breath, was briefly placed on 2 L nasal canula and given breathing treatment after which she felt better and wanted to go home, however due to mildly elevated troponin was transferred here   - recently hospitalized at [facility] for pneumonia and completed Abx course  - no evidence of pneumonia: afebrile normal WBC, no evidence on CXR done at outside ED  Plan  > Duoneb prn  > Continue prednisone  and doxy thru 7/22    Elevated troponin  HTN (hypertension)  HLD (hyperlipidemia)  CHF (congestive heart failure) (CMS-HCC)  - Troponin: 0.035 (elevated normal range 0.0000-0.013)   - EKG: Sinus rate 79, no acute ischemic changes, PACs, Mildly increased QTC 508  - Denies any chest pain prior or during ED visit also denies any chest pain now  - TTE with bubble study done 03/15/2024 shows EF 39% grade II diastolic function, dilated R ventricle, was started on Jardiance  on BBB, and ACEi  - Stress test 7/21: This study is abnormal with a moderate sized mixed perfusion defect in   the right coronary artery territory with both fixed and reversible   components. The defect is about 50% or more reversible.   2. Severe diffuse left ventricular hypokinesis both at rest and during   peak pharmacological stress. The resting left ventricular end-diastolic   volume is 154 mL. The resting left ventricular ejection fraction is 31%,   the peak stress ejection fraction is 34%.   3. Severe three-vessel coronary calcification.   4. Peak quantitative myocardial blood flow and myocardial flow reserve are   both severely depressed in the right coronary artery territory. Resting   quantitative myocardial blood flow in the inferior wall is within the   normal range implying viability.   5. Abnormal pharmacological stress electrocardiogram with minor ST-T wave   depression of 1 mm or less in the inferior leads and anterolateral   precordial leads. The specificity of this finding for ischemia is reduced   by the presence of baseline ST-T abnormalities. There are occasional   premature atrial contractions and a rare atrial couplet.   -  Echo 7.21 with EF 35%, markedly elevated CVP, dilated IVC  Plan  Cardiology following for further recommendations given abnormal stress results and echo   Resume aspirin , statin, Lisinopril , Metoprolol  succinate 50 mg daily, Jardiance   Resumed lasix  and monitor volume status closely  Planning for PET stress 7/21    History of atrial fibrillation  - not in afib at this time, denies any palpitations,   - based on recent notes she has not been taking it at home   - EF was 39% most recently, back in 2024 I see that it was intended to be discontinued, she is on metoprolol  succinate   Digoxin  stopped. 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   - stumps on physical exam without any signs of infection, no pain, preserved sensation   Smoking cessation   Follow up with vascular surgery   PT/OT agreeable for rehab placement    DM2 (diabetes mellitus, type 2) (CMS-HCC)  - home regimen: insulin  17 units BID long acting + metformin 1000 mg daily   - last [facility] 03/15/24 8.3  Resume insulin  15 units BID + SSI + hypoglycemia protocol    History of stroke  - reportedly in 2018 without residual deficits   - On CT scan 03/14/2024 there is a low attenuation area within the right basal ganglia and chronic lacunar infarct within the Left basal ganglia MRI brain 03/15/24 did not show acute or recent stroke   Continue aspirin  and statin      MDD (major depressive disorder)  Resume sertraline      FEN: IVF, electrolytes stable, DIET CARDIAC(LOW FAT/LOW SODIUM)   PPX: eliquis   Code Status: Full Code     Dispo: Continue admission. As of 7/20, OT now recommending home.        Brian Prime, MD  Hospitalist, Internal Medicine    Voalte is the preferred method of communication.   Please use the Med Private First Call for all patient-related communications. Personal Voaltes and pagers are not answered at all hours.    Total Time Today was >50 minutes in the following activities: Preparing to see the patient, Obtaining and/or reviewing separately obtained history, Performing a medically appropriate examination and/or evaluation, Counseling and educating the patient/family/caregiver, Ordering medications, tests, or procedures, Referring and communication with other health care professionals (when not separately reported), Documenting clinical information in the electronic or other health record, Independently interpreting results (not separately reported) and communicating results to the patient/family/caregiver and Care coordination (not separately reported)      _______________________________________________________________________    Subjective  Ginny DELENA Anon is a 62 y.o. female.  No events overnight. Seen after stress test, feeling tired and hungry but otherwise no changes. Denies shortness of breath or wheezing.     Medications  Scheduled Meds:apixaban  (ELIQUIS ) tablet 5 mg, 5 mg, Oral, BID  aspirin  EC (ASPIR-LOW) tablet 81 mg, 81 mg, Oral, QDAY  atorvastatin  (LIPITOR) tablet 80 mg, 80 mg, Oral, QHS  doxycycline  hyclate (VIBRACIN) tablet 100 mg, 100 mg, Oral, BID  empagliflozin  (JARDIANCE ) tablet 25 mg, 25 mg, Oral, QDAY  ezetimibe  (ZETIA ) tablet 10 mg, 10 mg, Oral, QDAY  furosemide  (LASIX ) tablet 40 mg, 40 mg, Oral, BID  insulin  aspart (U-100) (NOVOLOG  FLEXPEN U-100 INSULIN ) injection PEN 0-6 Units, 0-6 Units, Subcutaneous, ACHS (22)  insulin   glargine (LANTUS  SOLOSTAR U-100 INSULIN ) injection PEN 15 Units, 15 Units, Subcutaneous, BID  [Held by Provider] metoprolol  succinate XL (TOPROL  XL) tablet 50 mg, 50 mg, Oral, QDAY  nicotine  (NICODERM CQ ) 21 mg/day patch 1 patch, 1 patch, Transdermal, QDAY  predniSONE  (DELTASONE ) tablet 40 mg, 40 mg, Oral, QDAY  sacubitriL -valsartan  (ENTRESTO ) 24-26 mg tablet 1 tablet, 1 tablet, Oral, BID  sertraline  (ZOLOFT ) tablet 50 mg, 50 mg, Oral, QDAY  sodium chloride  PF 0.9% injection 10 mL, 10 mL, Intravenous, ONCE  spironolactone  (ALDACTONE ) tablet 25 mg, 25 mg, Oral, QDAY    Continuous Infusions:  PRN and Respiratory Meds:acetaminophen  Q4H PRN, dextrose  50% (D50) IV PRN, magnesium  oxide PRN **OR** magnesium  sulfate PRN (On Call from Rx), melatonin QHS PRN, ondansetron  Q6H PRN **OR** ondansetron  (ZOFRAN ) IV Q6H PRN, perflutren  lipid microspheres Once PRN **AND** sodium chloride  PF 0.9% ONCE, polyethylene glycol 3350  QDAY PRN, potassium chloride  PRN **OR** potassium chloride  PRN **OR** potassium chloride  in water  PRN (On Call from Rx), sennosides-docusate sodium  QDAY PRN, sodium chloride  0.9% TKO infusion PRN        Objective:                          Vital Signs: Last Filed                 Vital Signs: 24 Hour Range   BP: 143/60 (07/21 0802)  Temp: 36.9 ?C (98.4 ?F) (07/21 0802)  Pulse: 52 (07/21 0802)  Respirations: 18 PER MINUTE (07/21 0802)  SpO2: 98 % (07/21 0802)  O2 Device: None (Room air) (07/21 0802) BP: (116-143)/(46-81)   Temp:  [36.3 ?C (97.4 ?F)-36.9 ?C (98.4 ?F)]   Pulse:  [51-74]   Respirations:  [16 PER MINUTE-20 PER MINUTE]   SpO2:  [91 %-99 %]   O2 Device: None (Room air)   Intensity Pain Scale (Self Report): 0 (03/21/24 2056) Vitals:    03/18/24 2227   Weight: 78.5 kg (173 lb 1 oz)         Intake/Output Summary:  (Last 24 hours)    Intake/Output Summary (Last 24 hours) at 03/22/2024 0848  Last data filed at 03/22/2024 0800  Gross per 24 hour   Intake 200 ml   Output 350 ml   Net -150 ml           Physical Exam  Physical Exam  HENT:      Head: Normocephalic.   Cardiovascular:      Rate and Rhythm: Normal rate.   Pulmonary:      Effort: Pulmonary effort is normal. No respiratory distress.      Breath sounds: No wheezing.   Neurological:      Mental Status: She is alert and oriented to person, place, and time.   Psychiatric:         Mood and Affect: Mood normal.           Lab Review  Pertinent labs reviewed and noted in A/P.     Point of Care Testing  (Last 24 hours)  Glucose: 81 (03/22/24 0557)  POC Glucose (Download): 79 (03/22/24 0802)    Radiology and other Diagnostics Review:    Pertinent radiology reviewed and noted in A/P.

## 2024-03-23 ENCOUNTER — Inpatient Hospital Stay: Admit: 2024-03-19 | Payer: MEDICAID

## 2024-03-23 DIAGNOSIS — F17210 Nicotine dependence, cigarettes, uncomplicated: Secondary | ICD-10-CM

## 2024-03-23 DIAGNOSIS — I700 Atherosclerosis of aorta: Secondary | ICD-10-CM

## 2024-03-23 DIAGNOSIS — I480 Paroxysmal atrial fibrillation: Secondary | ICD-10-CM

## 2024-03-23 DIAGNOSIS — Z794 Long term (current) use of insulin: Secondary | ICD-10-CM

## 2024-03-23 DIAGNOSIS — E876 Hypokalemia: Secondary | ICD-10-CM

## 2024-03-23 DIAGNOSIS — E785 Hyperlipidemia, unspecified: Secondary | ICD-10-CM

## 2024-03-23 DIAGNOSIS — Z90710 Acquired absence of both cervix and uterus: Secondary | ICD-10-CM

## 2024-03-23 DIAGNOSIS — Z91199 Patient's noncompliance with other medical treatment and regimen due to unspecified reason: Secondary | ICD-10-CM

## 2024-03-23 DIAGNOSIS — I110 Hypertensive heart disease with heart failure: Secondary | ICD-10-CM

## 2024-03-23 DIAGNOSIS — Z8673 Personal history of transient ischemic attack (TIA), and cerebral infarction without residual deficits: Secondary | ICD-10-CM

## 2024-03-23 DIAGNOSIS — I340 Nonrheumatic mitral (valve) insufficiency: Secondary | ICD-10-CM

## 2024-03-23 DIAGNOSIS — E119 Type 2 diabetes mellitus without complications: Secondary | ICD-10-CM

## 2024-03-23 DIAGNOSIS — I2510 Atherosclerotic heart disease of native coronary artery without angina pectoris: Secondary | ICD-10-CM

## 2024-03-23 DIAGNOSIS — J9601 Acute respiratory failure with hypoxia: Secondary | ICD-10-CM

## 2024-03-23 DIAGNOSIS — I6523 Occlusion and stenosis of bilateral carotid arteries: Secondary | ICD-10-CM

## 2024-03-23 DIAGNOSIS — J449 Chronic obstructive pulmonary disease, unspecified: Secondary | ICD-10-CM

## 2024-03-23 DIAGNOSIS — Z89512 Acquired absence of left leg below knee: Secondary | ICD-10-CM

## 2024-03-23 DIAGNOSIS — Z7401 Bed confinement status: Secondary | ICD-10-CM

## 2024-03-23 DIAGNOSIS — Z993 Dependence on wheelchair: Secondary | ICD-10-CM

## 2024-03-23 DIAGNOSIS — Z716 Tobacco abuse counseling: Secondary | ICD-10-CM

## 2024-03-23 DIAGNOSIS — Z89511 Acquired absence of right leg below knee: Secondary | ICD-10-CM

## 2024-03-23 DIAGNOSIS — F329 Major depressive disorder, single episode, unspecified: Secondary | ICD-10-CM

## 2024-03-23 DIAGNOSIS — Z79899 Other long term (current) drug therapy: Secondary | ICD-10-CM

## 2024-03-23 DIAGNOSIS — Z7984 Long term (current) use of oral hypoglycemic drugs: Secondary | ICD-10-CM

## 2024-03-23 DIAGNOSIS — I5A Non-ischemic myocardial injury (non-traumatic): Secondary | ICD-10-CM

## 2024-03-23 DIAGNOSIS — Z7901 Long term (current) use of anticoagulants: Secondary | ICD-10-CM

## 2024-03-23 DIAGNOSIS — I5022 Chronic systolic (congestive) heart failure: Secondary | ICD-10-CM

## 2024-03-23 DIAGNOSIS — Z8701 Personal history of pneumonia (recurrent): Secondary | ICD-10-CM

## 2024-03-23 DIAGNOSIS — Z7982 Long term (current) use of aspirin: Secondary | ICD-10-CM

## 2024-03-23 LAB — POC GLUCOSE
~~LOC~~ BKR POC GLUCOSE: 189 mg/dL — ABNORMAL HIGH (ref 70–100)
~~LOC~~ BKR POC GLUCOSE: 146 mg/dL — ABNORMAL HIGH (ref 70–100)
~~LOC~~ BKR POC GLUCOSE: 94 mg/dL (ref 70–100)

## 2024-03-23 MED ORDER — SPIRONOLACTONE 25 MG PO TAB
25 mg | ORAL_TABLET | Freq: Every day | ORAL | 0 refills | 90.00000 days | Status: AC
Start: 2024-03-23 — End: ?

## 2024-03-23 MED ORDER — EZETIMIBE 10 MG PO TAB
10 mg | ORAL_TABLET | Freq: Every day | ORAL | 0 refills | 90.00000 days | Status: AC
Start: 2024-03-23 — End: ?

## 2024-03-23 MED ORDER — SACUBITRIL-VALSARTAN 24-26 MG PO TAB
1 | ORAL_TABLET | Freq: Two times a day (BID) | ORAL | 0 refills | 30.00000 days | Status: AC
Start: 2024-03-23 — End: ?

## 2024-03-23 MED ORDER — MAGNESIUM SULFATE IN D5W 1 GRAM/100 ML IV PGBK
1 g | INTRAVENOUS | 0 refills | Status: CP
Start: 2024-03-23 — End: ?
  Administered 2024-03-23: 15:00:00 1 g via INTRAVENOUS

## 2024-03-23 MED ORDER — POTASSIUM CHLORIDE 20 MEQ PO TBTQ
40 meq | ORAL_TABLET | Freq: Two times a day (BID) | ORAL | 0 refills | 30.00000 days | Status: AC
Start: 2024-03-23 — End: ?

## 2024-03-23 MED ORDER — POTASSIUM CHLORIDE 20 MEQ PO TBTQ
60 meq | Freq: Two times a day (BID) | ORAL | 0 refills | Status: DC
Start: 2024-03-23 — End: 2024-03-23

## 2024-03-23 MED ADMIN — SODIUM CHLORIDE 0.9 % IV PGBK (MB+) [95161]: 100 mL | INTRAVENOUS | @ 15:00:00 | Stop: 2024-03-23 | NDC 00338915930

## 2024-03-23 NOTE — Case Management (ED)
 CMA Note:       Set up wheelchair transportation with [employer] to get patient home today at 3:30 with [employer] paying $370 per request of Brittney Swank, RNCM.    Chiquita Landry  Case Management Assistant  For additional assistance please contact RNCM  *

## 2024-03-23 NOTE — Discharge Instructions - Pharmacy
 Discharge Summary      Name: Sara Weber  Medical Record Number: [id number]        Account Number:  [id number]  Date Of Birth:  [birth year]                         Age:  63 y.o.  Admit date:  03/18/2024                     Discharge date: 03/23/2024      Discharge Attending:  Rollene Prime, MD  Discharge Summary Completed By: Rollene VEAR Prime, MD    Service: Med Private [phone]    Reason for hospitalization:  Acute hypoxic respiratory failure (CMS-HCC) [J96.01]    Primary Discharge Diagnosis:   Same as Above    Hospital Diagnoses:  Hospital Problems        Active Problems    * (Principal) Acute hypoxic respiratory failure (CMS-HCC)    HTN (hypertension)    HLD (hyperlipidemia)    History of atrial fibrillation    Chronic HFrEF (heart failure with reduced ejection fraction) (CMS-HCC)    COPD (chronic obstructive pulmonary disease) (CMS-HCC)    PVD (peripheral vascular disease)    S/P bilateral BKA (below knee amputation) (CMS-HCC)    DM2 (diabetes mellitus, type 2) (CMS-HCC)    Current tobacco use    History of stroke    Elevated troponin    MDD (major depressive disorder)    Bilateral carotid artery stenosis     Present on Admission:   Acute hypoxic respiratory failure (CMS-HCC)        Significant Past Medical History        DM (diabetes mellitus) (CMS-HCC)  TIA (transient ischemic attack)    Allergies   Patient has no known allergies.    Brief Hospital Course   The patient was admitted and the following issues were addressed during this hospitalization: (with pertinent details including admission exam/imaging/labs).      ELEA Weber is a 63 y.o. female with PMH significant for tobacco use, COPD, CHF, HTN, HLD admitted to the Internal Medicine service for shortness of breath and troponin elevation.            On admit, troponin elevated. EKG without ischemic changes. Patient without chest pain. Admitted to medicine, treated for possible COPD exacerbation with duonebs, prednisone , doxy given significant smoking history. Cardiology consulted, echo with EF 35%, markedly elevated CVP, dilated IVC. Stress test performed and abnormal, however, due to no symptoms and nonadherence to medications + limited mobility at baseline not optimal candidate for LHC at this time so deferred.     Continue GDMT optimization with lisinopril  transition to Entresto . Continue Toprol , Jardiance , spironolactone . Continued PTA lasix , aspirin , eliquis , statin. Added zetia  and counseled on smoking cessation.    Follows with OSH cardiologist and will need to continue to follow with them in the future. Otherwise uncomplicated hospitalization.     Day of discharge exam notable for:   Vitals:    03/23/24 1122   BP: (!) 165/63  Comment: RN notified   Pulse:    Temp: 36.1 ?C (97 ?F)   SpO2: 95%   O2 Device:    O2 Liter Flow:      Physical Exam  Vitals reviewed.   HENT:      Head: Normocephalic.   Pulmonary:      Effort: Pulmonary effort is normal. No respiratory distress.  Breath sounds: No wheezing.   Musculoskeletal:      Comments: bilateral BKA   Neurological:      Mental Status: She is alert and oriented to person, place, and time.           Items Needing Follow Up   Pending items or areas that need to be addressed at follow up:   Cardiology follow up Sara Weber)    Pending Labs and Follow Up Radiology    Pending labs and/or radiology review at this time of discharge are listed below: Please note- any labs with collected status will not have a result; if this area is blank, there are no items for review.         Medications      Medication List      START taking these medications     ezetimibe  10 mg tablet; Commonly known as: ZETIA ; Dose: 10 mg; Take one   tablet by mouth daily. Indications: high cholesterol; For: high   cholesterol; Quantity: 30 tablet; Refills: 0; Start taking on: March 24, 2024   sacubitriL -valsartan  24-26 mg tablet; Commonly known as: ENTRESTO ; Dose:   1 tablet; Take one tablet by mouth twice daily. Indications: chronic heart   failure; For: chronic heart failure; Quantity: 60 tablet; Refills: 0   spironolactone  25 mg tablet; Commonly known as: ALDACTONE ; Dose: 25 mg;   Take one tablet by mouth daily. Take with food.  Indications: heart   failure with reduced ejection fraction; For: heart failure with reduced   ejection fraction; Quantity: 30 tablet; Refills: 0; Start taking on: March 24, 2024     CHANGE how you take these medications     potassium chloride  SR 20 mEq tablet; Commonly known as: K-DUR; Dose: 40   mEq; Take two tablets by mouth twice daily with meals. Indications:   prevention of low potassium in the blood; For: prevention of low potassium   in the blood; Quantity: 120 tablet; Refills: 0; What changed: medication   strength, how much to take, when to take this     CONTINUE taking these medications     apixaban  5 mg tablet; Commonly known as: ELIQUIS ; Dose: 5 mg; Refills: 0   aspirin  EC 81 mg tablet; Commonly known as: ASPIR-LOW; Dose: 81 mg;   Refills: 0   atorvastatin  40 mg tablet; Commonly known as: LIPITOR; Dose: 40 mg;   Refills: 0   empagliflozin  25 mg tablet; Commonly known as: JARDIANCE ; Dose: 25 mg;   Refills: 0   ferrous sulfate 325 mg (65 mg iron) tablet; Commonly known as: FEOSOL;   Dose: 325 mg; Refills: 0   furosemide  40 mg tablet; Commonly known as: LASIX ; Dose: 40 mg; Refills:   0   insulin  glargine 100 unit/mL (3 mL) subcutaneous PEN; Commonly known as:   LANTUS  SOLOSTAR U-100 INSULIN ; Dose: 25 Units; Refills: 0   metFORMIN-XR 500 mg extended release tablet; Commonly known as:   GLUCOPHAGE XR; Dose: 1,000 mg; Refills: 0   methocarbamoL  500 mg tablet; Commonly known as: ROBAXIN ; Dose: 500 mg;   Take one tablet by mouth at bedtime as needed for Spasms.; Quantity: 30   tablet; Refills: 0   metoprolol  succinate XL 50 mg extended release tablet; Commonly known   as: TOPROL  XL; Dose: 50 mg; Refills: 0   sertraline  50 mg tablet; Commonly known as: ZOLOFT ; Dose: 50 mg;   Refills: 0     STOP taking these medications  digoxin  125 mcg (0.125 mg) tablet; Commonly known as: LANOXIN    lisinopriL  40 mg tablet; Commonly known as: ZESTRIL        Return Appointments and Scheduled Appointments   No appointment scheduled    Consults, Procedures, Diagnostics, Micro, Pathology   Consults: Cardiology  Surgical Procedures & Dates: None  Significant Diagnostic Studies, Micro and Procedures: noted in brief hospital course  Significant Pathology: none     Wound:      Wounds Pressure injury Right Buttocks (Active)   03/18/24 2229   Wound Type: Pressure injury   Orientation: Right   Location: Buttocks   Wound Location Comments:    Initial Wound Site Closure:    Initial Dressing Placed:    Initial Cycle:    Initial Suction Setting (mmHg):    Pressure Injury Stages: Stage 1   Pressure Injury Present Within 24 Hours of Hospital Admission: Yes   If This Pressure Injury Is Suspected to Be Device Related, Please Select the Device::    Is the Wound Open or Closed:    Wound Assessment Pink;Purple;Red 03/22/24 2200   Peri-wound Assessment Pink 03/22/24 2200   Wound Drainage Amount None 03/22/24 2200   Wound Dressing Status None/open to air 03/22/24 2200   Wound Care Treatment or ointment applied 03/21/24 1200   Wound Dressing and/or Treatment Barrier cream 03/21/24 1200   Number of days: 5       Wounds Moisture associated skin damage Coccyx (Active)   03/18/24 2230   Wound Type: Moisture associated skin damage   Orientation:    Location: Coccyx   Wound Location Comments:    Initial Wound Site Closure:    Initial Dressing Placed:    Initial Cycle:    Initial Suction Setting (mmHg):    Pressure Injury Stages:    Pressure Injury Present Within 24 Hours of Hospital Admission:    If This Pressure Injury Is Suspected to Be Device Related, Please Select the Device::    Is the Wound Open or Closed:    Wound Assessment Pink;Moist 03/22/24 2200   Peri-wound Assessment Pink 03/22/24 2200   Wound Drainage Amount None 03/22/24 2200   Wound Dressing Status None/open to air 03/22/24 2200   Wound Care Treatment or ointment applied 03/21/24 1200   Wound Dressing and/or Treatment Barrier cream 03/21/24 1200   Number of days: 5                        Discharge Disposition, Condition   Patient Disposition: Home or Self Care [01]  Condition at Discharge: Stable    Code Status   Full Code    Patient Instructions     Activity       Activity as Tolerated   As directed      It is important to keep increasing your activity level after you leave the hospital.  Moving around can help prevent blood clots, lung infection (pneumonia) and other problems.  Gradually increasing the number of times you are up moving around will help you return to your normal activity level more quickly.  Continue to increase the number of times you are up to the chair and walking daily to return to your normal activity level. Begin to work towards your normal activity level at discharge.          Diet       Cardiac Diet   As directed      Limiting unhealthy fats and cholesterol is the  most important step you can take in reducing your risk for cardiovascular disease.  Unhealthy fats include saturated and trans fats.  Monitor your sodium and cholesterol intake.  Restrict your sodium to 2g (grams) or 2000mg  (milligrams) daily, and your cholesterol to 200mg  daily.    If you have questions regarding your diet at home, you may contact a dietitian at [phone].    Diabetic Diet   As directed      You should eat between 1600 and 2000 calories per day.  This is equal to 60g (grams) of carbohydrates per meal, and 30g of carbohydrates for a bedtime snack.    If you have questions about your diet after you go home, you can call a dietitian at [phone].             Discharge education provided to patient., Signs and Symptoms:   Report these signs and symptoms       Report These Signs and Symptoms   As directed      Please contact your doctor if you have any of the following symptoms: *Continue medicines as direct on your discharge medication list. Get an up to date list with every visit and take as instructed. Do NOT stop taking any medications without speaking with your doctor or nurse who knows you.    *Remember to weigh yourself first thing in the morning after using the restroom and write it down. Take this record to your doctor appointment.    *Chart symptoms such as fatigue, trouble breathing, or swelling. Call your doctor right away if these symptoms get worse.    *Remember, do not eat more than 2000 milligrams of sodium a day. Watch out for packaged, processed, canned and restaurant foods.    Call your doctor (your cardiologist, if you have one) if:  -you gain more than 2 pounds in 24 hours.  -you gain more than 5 pounds in 1 week.  -any of your symptoms get worse  Do NOT wait to let your doctor know about these changes.        , Education:   Education       Risk Reduction Plan   As directed      Cardiac Event Personal Risk Factor Reduction Plan  **Take this sheet to your physician to show treatment recommendations**  MALISSIE MUSGRAVE  Admission Date: 03/18/2024 LOS: @LOS @       Blood Pressure Risk  Goal: Keep blood pressure below 130/80  Your Numbers:  BP Readings from Last 1 Encounters:  03/23/24 : (!) 169/64     Plan: High blood pressure is the single most important risk factor for cardiac events because it's the #1 cause of cardiac events.  Take medication as prescribed and monitor your blood pressure.    Abnormal lipids (fats in blood) Risk   Goal: Total Cholesterol: <200  LDL (bad cholesterol): primary prevention <100   LDL (bad cholesterol): secondary prevention < 70  HDL (good cholesterol): >40 for men, >50 for women  Triglycerides: <150  Your Numbers: No results found for: CHOL  No results found for: LDL  No results found for: HDL  No results found for: TRIG  Plan: Diets high in saturated fat, trans fat and cholesterol can raise blood cholesterol levels increasing your risk of having a cardiac event.  Take medication as prescribed and eat a heart healthy, low sodium diet.    Smoking Risk   Goals: If you smoke, STOP!   Plan: Smoking DOUBLES  your risk of having a cardiac event. Quitting can greatly reduce your risk. To register for smoking cessation program call 1-800-QUIT-NOW [phone]) 876-1451) or visit www.smokefree.gov    Diabetes Risk   Goal: Non-diabetic: Below 5.7%  Goal for diabetic: Less than 7%  Your Numbers: No results found for: HGBA1C  Plan: If you have diabetes, even if treated, you are at an increased risk of having a cardiac event.     Alcohol Use Risk  Goal: Alcohol use can lead to a cardiac event.  Plan: For men, limit intake to no more than 2 drinks per day.  For women, limit intake to no more than one drink per day.    Weight Management Risk   Goal: Healthy: BMI is 18.5 to 24.9  Overweight: BMI is 25 to 29.9  Obese: BMI is 30 or higher  Morbid Obesity: 40 or higher  Your Numbers: Your BMI (Calculated): 28.79  Plan: If you have questions about your diet after you go home, you can call a dietitian at [phone]    Physical Activity Risk   Goal: Patients should have approval by a physician prior to beginning an exercise program.  Plan: Try to get at least 30 minutes of moderate physical activity five days a week or 20 minutes of vigorous physical activity three days a week with your doctor's approval.    Knowing the signs and symptoms of stroke and understanding the risk factors that can lead to both a heart attack and stroke are important for staying healthy. Heart attacks and strokes have a significant overlap, as many of the same lifestyle, family history, and health conditions can increase the chances of both cardiovascular and stroke events.    B.E. F.A.S.T. is an easy way to remember the sudden signs of stroke.     B.E. F.A.S.T. Stands for...     B  -  Balance  Is there a sudden difficulty with balance or coordination? Is walking or sitting difficult?  E  -  Eyes  Is there a sudden change in eyesight such as blurred vision, double vision, or a loss of vision in one or both eyes without pain?   F  -  Face   Does one side of the face droop or is it numb?  Ask the person to smile.  A  -  Arm   Is one arm weak or numb?  Ask the person to raise both arms.  Does one arm drift downward?  S  -  Speech   Is speech slurred or difficult to understand?  Are they unable to speak at all?  Ask the person to repeat is simple sentence like It is a bright and sunny day.    T  -  Time to call 911!  If a person shows any of these symptoms, even if they go away, call 911 to get them to the hospital immediately.  Time is very important.  The sooner they get to the hospital, the more likely they are to receive stroke reversing and potentially life-saving treatment.        , and Others Instructions:   Other Orders       Questions About Your Stay   Complete by: As directed      Discharging attending physician: LORING ROLLENE DEL    Order comments: If you have an emergency after discharge, please dial 9-1-1.    You may contact your discharging physician up to 7 days after discharge for questions about your  hospitalization, discharge instructions, or medications by calling [phone] during regular business hours (8AM-4PM) and asking to speak to the doctor listed on the discharge information.       If you are not calling during business hours, ask for the on-call doctor.    If you have new  or worsening symptoms, you may be directed to your primary care provider (PCP) for ongoing questions, an Emergency Department, or an Urgent Care Clinic for a more immediate evaluation.    For all calls or questions more than 7 days after discharge, please contact your primary care provider (PCP).    For medications after discharge: pain (opioid) medicine cannot be refilled or prescribed by calling your discharging physician.  These medications need to be filled by your primary care provider (PCP).  Regular refill requests should be directed to your primary care provider (PCP).            Additional Orders: Case Management, Supplies, Home Health     Home Health/DME       None              Signed:  Rollene VEAR Prime, MD  03/23/2024      cc:  Primary Care Physician:  Glynn, Kerstin   PCP Unknown    Referring physicians:  Unknown, Unknown, MD   Additional provider(s):        Did we miss something? If additional records are needed, please fax a request on office letterhead to [phone]. Please include the patient's name, date of birth, fax number and type of information needed. Additional request can be made by email at [email or web address]. For general questions of information about electronic records sharing, call [phone].     Greater than 30 minutes spent on discharge including patient visit, chart review, discharge orders, and documentation.

## 2024-03-23 NOTE — Progress Notes
 1020: Patient refusing morning medications: aspirin , jardiance , zetia , lasix , metoprolol , nicotine  patch, potassium chloride , prednisone , entresto , zoloft , and spironolactone  because patient states my stomach hurts, I'm not taking those. RN educated patient on the importance of these medications, patient agreed to take eliquis . Patient's BP = 169/64 @1025  and 165/63 @1122 . Dr. Rollene Prime notified.

## 2024-03-23 NOTE — Discharge Instructions - Appointments
 Please follow up with your cardiologist at [facility] within 1-2 weeks after discharge.

## 2024-04-27 ENCOUNTER — Encounter: Admit: 2024-04-27 | Discharge: 2024-04-27 | Payer: MEDICARE
